# Patient Record
Sex: Female | Born: 1975 | Race: White | Hispanic: No | Smoking: Former smoker
Health system: Southern US, Community
[De-identification: ages and names within clinical notes are randomized; demographics above are authoritative.]

## PROBLEM LIST (undated history)

## (undated) DIAGNOSIS — R51 Headache: Secondary | ICD-10-CM

## (undated) DIAGNOSIS — Z9889 Other specified postprocedural states: Secondary | ICD-10-CM

## (undated) DIAGNOSIS — M1990 Unspecified osteoarthritis, unspecified site: Secondary | ICD-10-CM

## (undated) DIAGNOSIS — J45909 Unspecified asthma, uncomplicated: Secondary | ICD-10-CM

## (undated) DIAGNOSIS — R112 Nausea with vomiting, unspecified: Secondary | ICD-10-CM

## (undated) HISTORY — PX: TUBAL LIGATION: SHX77

---

## 2003-05-28 ENCOUNTER — Inpatient Hospital Stay (HOSPITAL_COMMUNITY): Admission: AD | Admit: 2003-05-28 | Discharge: 2003-05-28 | Payer: Self-pay | Admitting: Obstetrics and Gynecology

## 2003-08-13 ENCOUNTER — Encounter (INDEPENDENT_AMBULATORY_CARE_PROVIDER_SITE_OTHER): Payer: Self-pay | Admitting: *Deleted

## 2003-08-13 ENCOUNTER — Inpatient Hospital Stay (HOSPITAL_COMMUNITY): Admission: AD | Admit: 2003-08-13 | Discharge: 2003-08-15 | Payer: Self-pay | Admitting: Obstetrics and Gynecology

## 2003-11-20 ENCOUNTER — Other Ambulatory Visit: Admission: RE | Admit: 2003-11-20 | Discharge: 2003-11-20 | Payer: Self-pay | Admitting: Obstetrics and Gynecology

## 2004-03-28 DIAGNOSIS — M1990 Unspecified osteoarthritis, unspecified site: Secondary | ICD-10-CM

## 2004-03-28 HISTORY — DX: Unspecified osteoarthritis, unspecified site: M19.90

## 2004-12-07 ENCOUNTER — Other Ambulatory Visit: Admission: RE | Admit: 2004-12-07 | Discharge: 2004-12-07 | Payer: Self-pay | Admitting: Family Medicine

## 2005-05-20 ENCOUNTER — Encounter: Admission: RE | Admit: 2005-05-20 | Discharge: 2005-05-20 | Payer: Self-pay | Admitting: Family Medicine

## 2005-12-26 ENCOUNTER — Other Ambulatory Visit: Admission: RE | Admit: 2005-12-26 | Discharge: 2005-12-26 | Payer: Self-pay | Admitting: Family Medicine

## 2007-01-22 ENCOUNTER — Other Ambulatory Visit: Admission: RE | Admit: 2007-01-22 | Discharge: 2007-01-22 | Payer: Self-pay | Admitting: Family Medicine

## 2009-05-26 ENCOUNTER — Other Ambulatory Visit: Admission: RE | Admit: 2009-05-26 | Discharge: 2009-05-26 | Payer: Self-pay | Admitting: Family Medicine

## 2009-10-13 ENCOUNTER — Emergency Department (HOSPITAL_BASED_OUTPATIENT_CLINIC_OR_DEPARTMENT_OTHER): Admission: EM | Admit: 2009-10-13 | Discharge: 2009-10-13 | Payer: Self-pay | Admitting: Emergency Medicine

## 2010-03-28 DIAGNOSIS — Z9889 Other specified postprocedural states: Secondary | ICD-10-CM

## 2010-03-28 DIAGNOSIS — R112 Nausea with vomiting, unspecified: Secondary | ICD-10-CM

## 2010-03-28 HISTORY — DX: Nausea with vomiting, unspecified: R11.2

## 2010-03-28 HISTORY — PX: ENDOMETRIAL ABLATION: SHX621

## 2010-03-28 HISTORY — DX: Other specified postprocedural states: Z98.890

## 2010-06-02 ENCOUNTER — Other Ambulatory Visit (HOSPITAL_COMMUNITY)
Admission: RE | Admit: 2010-06-02 | Discharge: 2010-06-02 | Disposition: A | Discharge status: Home / Self Care | Payer: BC Managed Care – PPO | Source: Ambulatory Visit | Attending: Family Medicine | Admitting: Family Medicine

## 2010-06-02 ENCOUNTER — Other Ambulatory Visit: Payer: Self-pay | Admitting: Physician Assistant

## 2010-06-02 DIAGNOSIS — Z01419 Encounter for gynecological examination (general) (routine) without abnormal findings: Secondary | ICD-10-CM | POA: Insufficient documentation

## 2010-06-12 LAB — URINE MICROSCOPIC-ADD ON

## 2010-06-12 LAB — URINALYSIS, ROUTINE W REFLEX MICROSCOPIC
Bilirubin Urine: NEGATIVE
Protein, ur: NEGATIVE mg/dL
Urobilinogen, UA: 1 mg/dL (ref 0.0–1.0)

## 2010-07-07 ENCOUNTER — Other Ambulatory Visit: Payer: Self-pay | Admitting: Obstetrics and Gynecology

## 2010-08-13 NOTE — Op Note (Signed)
NAME:  Candice Shaw, Candice Shaw                      ACCOUNT NO.:  [id number]   MEDICAL RECORD NO.:  [id number]                   PATIENT TYPE:  INP   LOCATION:  9126                                 [facility]ILITY:  [facility]   PHYSICIAN:  Naima A. Dillard, M.D.              DATE OF BIRTH:  December 21, 1975   DATE OF PROCEDURE:  08/13/2003  DATE OF DISCHARGE:                                 OPERATIVE REPORT   PROCEDURE:  Vacuum assisted vaginal delivery.   DESCRIPTION OF PROCEDURE:  I was called to see the patient secondary to  maternal exhaustion.  The patient had been pushing for three hours. She was  told that her options were vacuum assisted vaginal delivery with the risk of  scalp abrasions, cephalohematoma, intraventricular brain hemorrhage versus a  cesarean section with the risk of bleeding, infection, damage to internal  organs like bowel or bladder. The patient and husband chose vacuum vaginal  delivery.  The patient was examined and noted to be plus 2 station at left  occiput anterior. Her bladder was attempted to be drained with a red rubber  catheter but no urine was seen to come probably secondary to the infant's  head. The vacuum was placed in the correct position and the head was vacuum  assisted to the perineum with four pulls, no pop-offs, each pull was in the  green zone at 500 mmHg and vacuum was in place for 12 minutes. There was  terminal meconium noted, there was no nuchal cord, body delivered without  difficulty.  The cord was clamped and cut, handed over to the nurse.  The  placenta was delivered spontaneously intact. There was a partial third  degree laceration which was prepared with #0 Vicryl and 2-0 chromic in the  normal fashion.  The placenta was sent to pathology, estimated blood loss  was about 300 mL. There were no complications. A rectal exam was done before  the third degree laceration was repaired to confirm third degree laceration  and after repair a rectal exam was done  and no sutures were noted to be  palpated.  The infant's Apgar were 8 & 9, a female infant born at 83.                                               Naima A. Normand Sloop, M.D.    NAD/MEDQ  D:  08/13/2003  T:  08/14/2003  Job:  [id number]

## 2010-08-13 NOTE — H&P (Signed)
NAME:  Candice Shaw, Candice Shaw                      ACCOUNT NO.:  [id number]   MEDICAL RECORD NO.:  [id number]                   PATIENT TYPE:  INP   LOCATION:  9165                                 [facility]   PHYSICIAN:  Hal Morales, M.D.             DATE OF BIRTH:  04/09/1975   DATE OF ADMISSION:  08/13/2003  DATE OF DISCHARGE:                                HISTORY & PHYSICAL   HISTORY:  Ms. Bocek is a 35 year old gravida 2, para 0, 0, 1, 0 at 40-1/7  weeks who presents with uterine contractions every 2 to 4 minutes times  several hours.  She denies any leaking or bleeding and reports positive  fetal movement.  Denies headache, visual symptoms or epigastric pain.  Does  complain of nausea but no vomiting.   PREGNANCY HAS BEEN REMARKABLE FOR:  1. History of irritable bowel syndrome.  2. Family history of cleft lip.  3. Equivocal rubella.  4. History of migraines.  5. First trimester spotting.  6. Skoliosis.  7. History of asthma.  8. Mild depression.   PRENATAL LABS:  Blood type is A positive.  Rh antibody negative.  VDRL  nonreactive.  Rubella titer is equivocal.  Hepatitis B surface antigen  negative.  Cystic fibrosis testing negative.  GC and Chlamydia were negative  at the first trimester.  Pap had been done previous to her first visit.  AFP  was normal.  Hemoglobin upon entering the practice was 13.9, it was 12.6 at  27 weeks.  Group B strep culture and gonorrhea/Chlamydia cultures were  negative at 36 weeks.  EDC on Aug 12, 2003 was established by last menstrual  period and was in agreement with ultrasound at approximately 11 and 18  weeks.   HISTORY OF PRESENT PREGNANCY:  The patient entered care at approximately 12  weeks.  She had an ultrasound just prior to that time that showed  appropriate dating.  She was using Darvocet for migraines at that point.  She had some first trimester spotting that was evaluated.  Quadruple screen  was normal, she had a normal  ultrasound at 18 weeks.  She some occasional  nausea and vomiting, and some diarrhea at 20 weeks.  She had some issues of  mild depression at 21 weeks, these did improve.  She had some laryngitis at  24 weeks.  She had a normal Glucola.  She had leg cramps throughout her  pregnancy but never had any abnormal findings.  She was seen by charge  admissions on May 28, 2003 for an asthma attack. She was using Phenergan  more frequently at that point.  She has some pustular lesions on her right  forearm at 35 weeks.  She was referred to the dermatologist for this.  The  rest of her pregnancy was essentially uncomplicated.  Beta strep, GC and  Chlamydia cultures were negative at 36 weeks.   OBSTETRICAL HISTORY:  In 1990  patient had a spontaneous miscarriage at 12  weeks without complications.   PAST MEDICAL HISTORY:  1. She was on Ortho-Tri-Cyclen until August of 2004.  2. She has a history of varicose veins.  3. She has a history of childhood asthma.  4. Irritable bowel syndrome.  5. Occasional urinary tract infections.   ALLERGIES:  She is sensitive to CODEINE.   FAMILY HISTORY:  She is unsure of her mother's history secondary to not  being raised by her.  Her father, paternal grandmother and paternal  grandfather had hypertension.  Her paternal grandmother had varicose veins.  Father had diabetes.  Father and paternal grandfather had renal disease and  were on dialysis.  Paternal grandmother had rheumatoid arthritis.   GENETIC HISTORY:  Remarkable for the paternal grandfather having a cleft  lip, and the patient having scoliosis.   SOCIAL HISTORY:  The patient is married to the father of the baby. He is  involved and supportive, his name is Tamanika Heiney.  The patient is  Caucasian, of the [place] faith.  She has been followed by the [facility], [place].  She denies any alcohol or drug  use during this pregnancy.  She was a smoker prior to  pregnancy.  She has a  high school education.  She is employed at a [employer] resources [employer].  Her  husband has a high school education, he is also currently employed.   PHYSICAL EXAMINATION:  VITAL SIGNS:  Stable, the patient is afebrile.  HEENT:  Within normal limits.  LUNGS:  Breath sounds are clear.  HEART:  Regular rate and rhythm without murmur.  BREASTS:  Soft and nontender.  ABDOMEN:  Fundal height is approximately 38 cm.  Estimated fetal weight is 7  to 7-1/2 pounds.  Uterine contractions every 2 to 4 minutes, 60 seconds in  duration, moderate to strong quality.  Fetal heart rate is reactive with no  decelerations, and negative spontaneously CST.  PELVIC:  Cervix is 4 cm, 100%, vertex at a -1 to 0 station with an intact  bag of water.  EXTREMITIES:  Deep tendon reflexes are 2+ without clonus.  There is trace  edema noted.   IMPRESSION:  1. Intrauterine pregnancy at 40-1/7 weeks.  2. Active labor.   PLAN:  1. Admit to birthing suite for consult with Dr. Pennie Rushing as attending     physician.  2. Routine certified nurse midwife orders.  3. Patient plans epidural.     Chip Boer L. Emilee Hero, C.N.M.                   Hal Morales, M.D.    Leeanne Mannan  D:  08/13/2003  T:  08/13/2003  Job:  [id number]

## 2010-08-13 NOTE — Discharge Summary (Signed)
NAME:  Candice Shaw, Candice Shaw                      ACCOUNT NO.:  [id number]   MEDICAL RECORD NO.:  [id number]                   PATIENT TYPE:  INP   LOCATION:  9126                                 [facility]   PHYSICIAN:  Hal Morales, M.D.             DATE OF BIRTH:  1975/04/03   DATE OF ADMISSION:  08/13/2003  DATE OF DISCHARGE:                                 DISCHARGE SUMMARY   ADMISSION DIAGNOSES:  1. Intrauterine pregnancy at term.  2. Active labor.  3. Negative group B streptococcus.   DISCHARGE DIAGNOSES:  1. Intrauterine pregnancy at term.  2. Active labor.  3. Negative group B streptococcus.  4. Status post vacuum-assisted vaginal delivery.  5. Undecided regarding contraception.  6. Breast feeding.   HOSPITAL PROCEDURES:  Vacuum-assisted vaginal delivery on Aug 13, 2003 for  delivery of a viable female infant named Eliberto Ivory who had Apgars of 8 and 9 and  weighed 8 pounds 2 ounces, attended in delivery by Dr. Jaymes Graff and  Mack Guise, C.N.M.   HOSPITAL COURSE:  Mrs. Bleecker is a 35 year old married black female gravida  2 para 0-0-1-0 at 33 and one-seventh weeks admitted in early active labor.  She progressed nicely in labor to complete by approximately 10:15 a.m. on  Aug 13, 2003 and had a strong urge to push at that time.  She pushed well  for approximately two-and-a-half hours and got the vertex to a +2 to +3  station but without significant progress beyond that and was significantly  exhausted.  Dr. Normand Sloop was then consulted for discussion of vacuum-assisted  delivery versus cesarean section and those were both reviewed with the  patient in detail by Dr. Normand Sloop.  The patient agreed to proceed with a  vacuum-assisted vaginal delivery attempt and underwent the same for delivery  of a viable female infant named Eliberto Ivory who weighed 8 pounds 2 ounces and had  Apgars of 8 and 9 on Aug 13, 2003.  Please see delivery note for further  details.  Postpartally the  patient has done well.  She is ambulating,  voiding, and eating and without difficulty.  Her vital signs are stable and  she is afebrile and she is tolerating a regular diet without difficulty.  She is breastfeeding well.  She is currently undecided regarding  contraception.  She is deemed ready for discharge today.   DISCHARGE INSTRUCTIONS:  As per the [facility] handout.   DISCHARGE MEDICATIONS:  1. Motrin 600 mg p.o. q.6h. p.r.n. for pain.  2. Darvocet one to two p.o. q.4-6h. p.r.n. for pain.  3. Prenatal vitamins daily.   DISCHARGE LABORATORY DATA:  Her hemoglobin is 10.8, her wbc count is 14.4,  and her platelets are 286.   DISCHARGE FOLLOW-UP:  In 6 weeks at [facility] or p.r.n.   DISCHARGE CONDITION:  Stable and well.     Concha Pyo. Duplantis, C.N.M.  Hal Morales, M.D.    SJD/MEDQ  D:  08/15/2003  T:  08/15/2003  Job:  [id number]

## 2010-09-21 ENCOUNTER — Ambulatory Visit (HOSPITAL_COMMUNITY)
Admission: EM | Admit: 2010-09-21 | Discharge: 2010-09-21 | Disposition: A | Discharge status: Home / Self Care | Payer: BC Managed Care – PPO | Source: Ambulatory Visit | Attending: Obstetrics and Gynecology | Admitting: Obstetrics and Gynecology

## 2010-09-21 DIAGNOSIS — Z302 Encounter for sterilization: Secondary | ICD-10-CM | POA: Insufficient documentation

## 2010-09-21 DIAGNOSIS — N920 Excessive and frequent menstruation with regular cycle: Secondary | ICD-10-CM | POA: Insufficient documentation

## 2010-09-21 LAB — CBC
Hemoglobin: 13.5 g/dL (ref 12.0–15.0)
MCH: 31.5 pg (ref 26.0–34.0)
Platelets: 276 10*3/uL (ref 150–400)
RDW: 12.3 % (ref 11.5–15.5)
WBC: 7.4 10*3/uL (ref 4.0–10.5)

## 2010-09-23 ENCOUNTER — Other Ambulatory Visit: Payer: Self-pay | Admitting: Obstetrics and Gynecology

## 2010-09-23 ENCOUNTER — Ambulatory Visit
Admission: RE | Admit: 2010-09-23 | Discharge: 2010-09-23 | Disposition: A | Discharge status: Home / Self Care | Payer: BC Managed Care – PPO | Source: Ambulatory Visit | Attending: Obstetrics and Gynecology | Admitting: Obstetrics and Gynecology

## 2010-09-23 DIAGNOSIS — R110 Nausea: Secondary | ICD-10-CM

## 2010-09-23 MED ORDER — IOHEXOL 300 MG/ML  SOLN
100.0000 mL | Freq: Once | INTRAMUSCULAR | Status: AC | PRN
  Administered 2010-09-23: 100 mL via INTRAVENOUS

## 2010-09-27 NOTE — Op Note (Signed)
Candice Shaw, Candice Shaw            ACCOUNT NO.:  [id number]  MEDICAL RECORD NO.:  [id number]  LOCATION:  [facility]                          [facility]  PHYSICIAN:  Patsy Baltimore, MD     DATE OF BIRTH:  May 22, 1975  DATE OF PROCEDURE:  09/21/2010 DATE OF DISCHARGE:                              OPERATIVE REPORT   PREOPERATIVE DIAGNOSIS:  Menorrhagia.  POSTOPERATIVE DIAGNOSIS:  Menorrhagia.  PROCEDURE PERFORMED: 1. Essure permanent sterilization. 2. NovaSure endometrial ablation.  SURGEON:  Patsy Baltimore, MD.  ANESTHESIA:  General.  SPECIMENS SENT:  None.  ESTIMATED BLOOD LOSS:  Minimal.  COMPLICATIONS:  The right Essure coil dislodged with insertion of the NovaSure and the coil was removed, therefore, the right tube remains patent.    Candice Shaw is a 35 year old para 1 who was seen in the office with a chief complaint of heavy menses.  She declined the alternatives and opted for NovaSure endometrial ablation.  For her comfort, we opted to do the procedure in the operating room as an outpatient procedure.  She also expressed a desire for permanent sterilization.  She currently uses oral contraceptive pills, but she was interested in the Essure permanent sterilization.  The risks, benefits and alternatives of the procedure were discussed with the patient and informed consent was obtained for the Essure sterilization and  NovaSure endometrial ablation.  On the day of surgery, she was taken to the operating room with IV fluids running.  She had SCDs on for DVT prophylaxis.  She was put under general anesthesia with ease, her legs were lifted up to the dorsal lithotomy position.  Her vagina and perineum were prepped and draped in the usual sterile fashion.  A time-out was called and we began the procedure.  A speculum was inserted into the vagina.  A single-toothed tenaculum was used to grasp the anterior lip of the cervix.  The cervical length was measured at  3-1/2 cm.  The total uterine length was measured at 7-1/2 cm giving a uterine cavity length of 4 cm.  The NovaSure machine was set accordingly.  Hysteroscopy was done and both fallopian tubal ostia were visualized.  The Essure coils were then inserted, the right side was relatively easy to do, but due to the angle, the left side was slightly more difficult to reach.  The position of the coils were confirmed and there were 3-4 coils sticking into the uterus once the coils were deployed.  After the Essure, the NovaSure device was inserted into the uterus.  With the cavity assessment, the width was determined to be 3 cm and the machine was set accordingly. The ablation was done over 1 minute and 6 seconds on 66 watts of power. It was noted that when the NovaSure device was removed from the uterus after the ablation, the right array had hooked onto the Essure coil. Hysteroscopy was done again and the left coil was seen intact and in place.  I was not able to visualize the right tubal ostia due to the ablation and fluffy tissue surrounding the ostium, therefore, the Essure coil was not replaced on the right side.  The patient tolerated the procedure well.  All the instruments  were removed from the vagina.  She was reawakened and transferred back to the PACU in stable condition.          ______________________________ Patsy Baltimore, MD     CO/MEDQ  D:  09/21/2010  T:  09/22/2010  Job:  [id number]  Electronically Signed by Patsy Baltimore MD on 09/27/2010 07:03:28 PM

## 2010-11-04 ENCOUNTER — Encounter (HOSPITAL_COMMUNITY): Payer: Self-pay | Admitting: *Deleted

## 2010-11-10 ENCOUNTER — Ambulatory Visit (HOSPITAL_COMMUNITY)
Admission: RE | Admit: 2010-11-10 | Discharge: 2010-11-10 | Disposition: A | Discharge status: Home / Self Care | Payer: BC Managed Care – PPO | Source: Ambulatory Visit | Attending: Obstetrics and Gynecology | Admitting: Obstetrics and Gynecology

## 2010-11-10 ENCOUNTER — Encounter (HOSPITAL_COMMUNITY): Payer: Self-pay | Admitting: Anesthesiology

## 2010-11-10 ENCOUNTER — Other Ambulatory Visit: Payer: Self-pay | Admitting: Obstetrics and Gynecology

## 2010-11-10 ENCOUNTER — Encounter (HOSPITAL_COMMUNITY)
Admission: RE | Disposition: A | Discharge status: Home / Self Care | Payer: Self-pay | Source: Ambulatory Visit | Attending: Obstetrics and Gynecology

## 2010-11-10 ENCOUNTER — Ambulatory Visit (HOSPITAL_COMMUNITY): Payer: BC Managed Care – PPO | Admitting: Anesthesiology

## 2010-11-10 DIAGNOSIS — Z302 Encounter for sterilization: Secondary | ICD-10-CM | POA: Insufficient documentation

## 2010-11-10 DIAGNOSIS — Z9851 Tubal ligation status: Secondary | ICD-10-CM

## 2010-11-10 HISTORY — DX: Headache: R51

## 2010-11-10 HISTORY — DX: Nausea with vomiting, unspecified: R11.2

## 2010-11-10 HISTORY — DX: Unspecified osteoarthritis, unspecified site: M19.90

## 2010-11-10 HISTORY — PX: LAPAROSCOPIC TUBAL LIGATION: SHX1937

## 2010-11-10 HISTORY — DX: Other specified postprocedural states: Z98.890

## 2010-11-10 LAB — CBC
HCT: 40.8 % (ref 36.0–46.0)
Hemoglobin: 14 g/dL (ref 12.0–15.0)
MCH: 31.6 pg (ref 26.0–34.0)
MCV: 92.1 fL (ref 78.0–100.0)
RBC: 4.43 MIL/uL (ref 3.87–5.11)

## 2010-11-10 SURGERY — LIGATION, FALLOPIAN TUBE, LAPAROSCOPIC
Anesthesia: General | Site: Abdomen | Laterality: Bilateral | Wound class: Clean Contaminated

## 2010-11-10 MED ORDER — PANTOPRAZOLE SODIUM 40 MG PO TBEC: 40.0000 mg | DELAYED_RELEASE_TABLET | Freq: Once | ORAL | Status: DC | PRN

## 2010-11-10 MED ORDER — LIDOCAINE HCL (CARDIAC) 20 MG/ML IV SOLN
INTRAVENOUS | Status: DC | PRN
  Administered 2010-11-10: 30 mg via INTRAVENOUS

## 2010-11-10 MED ORDER — FENTANYL CITRATE 0.05 MG/ML IJ SOLN
INTRAMUSCULAR | Status: AC
  Administered 2010-11-10: 50 ug via INTRAVENOUS
  Filled 2010-11-10: qty 2

## 2010-11-10 MED ORDER — FENTANYL CITRATE 0.05 MG/ML IJ SOLN
INTRAMUSCULAR | Status: AC
  Filled 2010-11-10: qty 5

## 2010-11-10 MED ORDER — LIDOCAINE HCL (CARDIAC) 20 MG/ML IV SOLN
INTRAVENOUS | Status: AC
  Filled 2010-11-10: qty 5

## 2010-11-10 MED ORDER — ROCURONIUM BROMIDE 100 MG/10ML IV SOLN
INTRAVENOUS | Status: DC | PRN
  Administered 2010-11-10: 30 mg via INTRAVENOUS

## 2010-11-10 MED ORDER — KETOROLAC TROMETHAMINE 30 MG/ML IJ SOLN
INTRAMUSCULAR | Status: AC
  Filled 2010-11-10: qty 1

## 2010-11-10 MED ORDER — GLYCOPYRROLATE 0.2 MG/ML IJ SOLN
INTRAMUSCULAR | Status: AC
  Filled 2010-11-10: qty 1

## 2010-11-10 MED ORDER — MIDAZOLAM HCL 2 MG/2ML IJ SOLN
INTRAMUSCULAR | Status: AC
  Filled 2010-11-10: qty 2

## 2010-11-10 MED ORDER — BUPIVACAINE HCL (PF) 0.25 % IJ SOLN
INTRAMUSCULAR | Status: DC | PRN
  Administered 2010-11-10: 20 mL

## 2010-11-10 MED ORDER — LACTATED RINGERS IV SOLN
INTRAVENOUS | Status: DC
  Administered 2010-11-10: 15:00:00 via INTRAVENOUS

## 2010-11-10 MED ORDER — ROCURONIUM BROMIDE 50 MG/5ML IV SOLN
INTRAVENOUS | Status: AC
  Filled 2010-11-10: qty 1

## 2010-11-10 MED ORDER — TRAMADOL HCL 50 MG PO TABS: 50.0000 mg | ORAL_TABLET | Freq: Four times a day (QID) | ORAL | Status: AC | PRN

## 2010-11-10 MED ORDER — METOCLOPRAMIDE HCL 10 MG PO TABS: 10.0000 mg | ORAL_TABLET | Freq: Once | ORAL | Status: DC | PRN

## 2010-11-10 MED ORDER — SCOPOLAMINE 1 MG/3DAYS TD PT72
MEDICATED_PATCH | TRANSDERMAL | Status: AC
  Administered 2010-11-10: 1.5 mg via TRANSDERMAL
  Filled 2010-11-10: qty 1

## 2010-11-10 MED ORDER — FAMOTIDINE 20 MG PO TABS: 20.0000 mg | ORAL_TABLET | Freq: Once | ORAL | Status: DC | PRN

## 2010-11-10 MED ORDER — GLYCOPYRROLATE 0.2 MG/ML IJ SOLN
INTRAMUSCULAR | Status: DC | PRN
  Administered 2010-11-10: .6 mg via INTRAVENOUS

## 2010-11-10 MED ORDER — KETOROLAC TROMETHAMINE 30 MG/ML IJ SOLN: 15.0000 mg | Freq: Once | INTRAMUSCULAR | Status: DC | PRN

## 2010-11-10 MED ORDER — FENTANYL CITRATE 0.05 MG/ML IJ SOLN
INTRAMUSCULAR | Status: DC | PRN
  Administered 2010-11-10 (×5): 50 ug via INTRAVENOUS
  Administered 2010-11-10: 100 ug via INTRAVENOUS

## 2010-11-10 MED ORDER — ACETAMINOPHEN 325 MG PO TABS: 325.0000 mg | ORAL_TABLET | ORAL | Status: DC | PRN

## 2010-11-10 MED ORDER — ONDANSETRON HCL 4 MG/2ML IJ SOLN
INTRAMUSCULAR | Status: AC
  Filled 2010-11-10: qty 2

## 2010-11-10 MED ORDER — HYDROCODONE-ACETAMINOPHEN 5-325 MG PO TABS
ORAL_TABLET | ORAL | Status: AC
  Filled 2010-11-10: qty 1

## 2010-11-10 MED ORDER — PROPOFOL 10 MG/ML IV EMUL
INTRAVENOUS | Status: AC
  Filled 2010-11-10: qty 20

## 2010-11-10 MED ORDER — PROMETHAZINE HCL 25 MG/ML IJ SOLN: 6.2500 mg | INTRAMUSCULAR | Status: DC | PRN

## 2010-11-10 MED ORDER — MIDAZOLAM HCL 5 MG/5ML IJ SOLN
INTRAMUSCULAR | Status: DC | PRN
  Administered 2010-11-10: 2 mg via INTRAVENOUS

## 2010-11-10 MED ORDER — ONDANSETRON HCL 4 MG/2ML IJ SOLN
INTRAMUSCULAR | Status: DC | PRN
  Administered 2010-11-10: 4 mg via INTRAVENOUS

## 2010-11-10 MED ORDER — LACTATED RINGERS IV SOLN
INTRAVENOUS | Status: DC
  Administered 2010-11-10: 17:00:00 via INTRAVENOUS

## 2010-11-10 MED ORDER — NEOSTIGMINE METHYLSULFATE 1 MG/ML IJ SOLN
INTRAMUSCULAR | Status: AC
  Filled 2010-11-10: qty 10

## 2010-11-10 MED ORDER — NEOSTIGMINE METHYLSULFATE 1 MG/ML IJ SOLN
INTRAMUSCULAR | Status: DC | PRN
  Administered 2010-11-10: 3 mg via INTRAVENOUS

## 2010-11-10 MED ORDER — KETOROLAC TROMETHAMINE 30 MG/ML IJ SOLN
INTRAMUSCULAR | Status: DC | PRN
  Administered 2010-11-10: 30 mg via INTRAVENOUS

## 2010-11-10 MED ORDER — PROPOFOL 10 MG/ML IV EMUL
INTRAVENOUS | Status: DC | PRN
  Administered 2010-11-10: 150 mg via INTRAVENOUS

## 2010-11-10 MED ORDER — CITRIC ACID-SODIUM CITRATE 334-500 MG/5ML PO SOLN: 30.0000 mL | Freq: Once | ORAL | Status: DC | PRN

## 2010-11-10 MED ORDER — DEXAMETHASONE SODIUM PHOSPHATE 4 MG/ML IJ SOLN
INTRAMUSCULAR | Status: DC | PRN
  Administered 2010-11-10: 8 mg via INTRAVENOUS

## 2010-11-10 MED ORDER — FENTANYL CITRATE 0.05 MG/ML IJ SOLN
INTRAMUSCULAR | Status: AC
  Filled 2010-11-10: qty 2

## 2010-11-10 MED ORDER — SCOPOLAMINE 1 MG/3DAYS TD PT72
1.0000 | MEDICATED_PATCH | Freq: Once | TRANSDERMAL | Status: DC | PRN
  Administered 2010-11-10: 1.5 mg via TRANSDERMAL

## 2010-11-10 MED ORDER — MUPIROCIN 2 % EX OINT
TOPICAL_OINTMENT | CUTANEOUS | Status: AC
  Administered 2010-11-10: 1
  Filled 2010-11-10: qty 22

## 2010-11-10 MED ORDER — FENTANYL CITRATE 0.05 MG/ML IJ SOLN
25.0000 ug | INTRAMUSCULAR | Status: DC | PRN
  Administered 2010-11-10 (×2): 50 ug via INTRAVENOUS

## 2010-11-10 SURGICAL SUPPLY — 17 items
APPLICATOR COTTON TIP 6IN STRL (MISCELLANEOUS) ×2 IMPLANT
CATH ROBINSON RED A/P 16FR (CATHETERS) ×2 IMPLANT
CLIP FILSHIE TUBAL LIGA STRL (Clip) ×2 IMPLANT
CLOTH BEACON ORANGE TIMEOUT ST (SAFETY) ×2 IMPLANT
DERMABOND ADVANCED (GAUZE/BANDAGES/DRESSINGS) ×2 IMPLANT
GLOVE BIOGEL M 6.5 STRL (GLOVE) ×2 IMPLANT
GLOVE BIOGEL PI IND STRL 6.5 (GLOVE) ×2 IMPLANT
GLOVE BIOGEL PI INDICATOR 6.5 (GLOVE) ×2
GOWN PREVENTION PLUS LG XLONG (DISPOSABLE) ×2 IMPLANT
GOWN PREVENTION PLUS XLARGE (GOWN DISPOSABLE) ×2 IMPLANT
PACK LAPAROSCOPY BASIN (CUSTOM PROCEDURE TRAY) ×2 IMPLANT
SUT VICRYL 0 UR6 27IN ABS (SUTURE) ×2 IMPLANT
SUT VICRYL 4-0 PS2 18IN ABS (SUTURE) ×2 IMPLANT
TOWEL OR 17X24 6PK STRL BLUE (TOWEL DISPOSABLE) ×4 IMPLANT
TROCAR XCEL NON-BLD 11X100MML (ENDOMECHANICALS) ×2 IMPLANT
WARMER LAPAROSCOPE (MISCELLANEOUS) ×2 IMPLANT
WATER STERILE IRR 1000ML POUR (IV SOLUTION) ×2 IMPLANT

## 2010-11-10 NOTE — Anesthesia Postprocedure Evaluation (Signed)
  Anesthesia Post Note  Patient: Candice Shaw  Procedure(s) Performed:  LAPAROSCOPIC TUBAL LIGATION - with filshie clips   Anesthesia type: GA  Patient location: PACU  Post pain: Pain level controlled  Post assessment: Post-op Vital signs reviewed  Last Vitals:  Filed Vitals:   11/10/10 1815  BP: 92/64  Pulse: 107  Temp:   Resp: 37    Post vital signs: Reviewed  Level of consciousness: sedated  Complications: No apparent anesthesia complications

## 2010-11-10 NOTE — H&P (Signed)
No changes to H&P.

## 2010-11-10 NOTE — Progress Notes (Signed)
Pt extremely anxious, crying and states pain is severe, emotional support and pain meds given will continue to monitor and manage.

## 2010-11-10 NOTE — Transfer of Care (Signed)
Immediate Anesthesia Transfer of Care Note  Patient: Candice Shaw  Procedure(s) Performed:  LAPAROSCOPIC TUBAL LIGATION - with filshie clips   Patient Location: PACU  Anesthesia Type: General  Level of Consciousness: awake, alert , oriented and patient cooperative  Airway & Oxygen Therapy: Patient Spontanous Breathing and Patient connected to nasal cannula oxygen  Post-op Assessment: Report given to PACU RN, Post -op Vital signs reviewed and stable and Patient moving all extremities  Post vital signs: Reviewed and stable  Complications: No apparent anesthesia complications

## 2010-11-10 NOTE — Progress Notes (Signed)
Pt states she cannot take Ultram as prescribed by md for pain due to nausea.  Dr Richardson Dopp made aware and verbal orders received, Vicodin 5/500 1-2 po q 6hrs prn as needed for pain, and Phenergan 25mg  1 po q 6hrs as needed for pain called to [facility] off [address]

## 2010-11-10 NOTE — Op Note (Signed)
11/10/2010  5:56 PM  PATIENT:  Candice Shaw  35 y.o. female  PRE-OPERATIVE DIAGNOSIS:  desire sterislization  POST-OPERATIVE DIAGNOSIS:  desire sterislization  PROCEDURE:  Procedure(s): LAPAROSCOPIC TUBAL LIGATION  SURGEON:  Surgeon(s): Dorien Chihuahua. Chandi Nicklin  PHYSICIAN ASSISTANT:   ASSISTANTS: none   ANESTHESIA:   general  ESTIMATED BLOOD LOSS: * No blood loss amount entered *   BLOOD ADMINISTERED:none  DRAINS: none   LOCAL MEDICATIONS USED:  MARCAINE  20 CC  SPECIMEN:  No Specimen  DISPOSITION OF SPECIMEN:  N/A  COUNTS:  YES  TOURNIQUET:  * No tourniquets in log *  DICTATION #: NA  PLAN OF CARE: d/c home once patient meets pacu discharge criteria   PATIENT DISPOSITION:  PACU - hemodynamically stable.   Delay start of Pharmacological VTE agent (>24hrs) due to surgical blood loss or risk of bleeding:  not applicable    Procedure: Patient was taken to the operating room where she was placed under general anesthesia. She was placed in the dorsal lithotomy position. She was prepped and draped in the usual sterile fashion. A speculum was placed in the vaginal vault. The anterior lip of the cervix was grasped with single-tooth tenaculum and uterine miniplate was placed without difficulty. Single-tooth tenaculum was removed. In the speculum was removed. Attention was turned to the abdomen where a 10 mm umbilical incision was made with a scalpel. Prior to making the incision on 10 cc of quarter percent Marcaine were injected at the umbilicus. A 10 mm trocar was placed under direct visualization. Pneumoperitoneum was achieved with CO2 gas. The pelvis was examined with an attempt normal fallopian tubes and ovaries bilaterally. At this point the operative scope was used and a Filshie clip was placed along the right fallopian tube fimbria were visualized. This is repeated on the left fallopian tube. 10 5 cc of quarter percent Marcaine was injected along the placement of the Filshie  clips bilaterally. Pneumoperitoneum was released and the 10 mm trocar was removed under direct visualization. Fascia was reapproximated with 0 Vicryl. Skin was closed with 4-0 Vicryl. Followed by Dermabond. Sponge and needle counts were correct x2.  Findings normal fallopian tubes and ovaries bilaterally. Complications none.

## 2010-11-10 NOTE — Anesthesia Preprocedure Evaluation (Addendum)
Anesthesia Evaluation  Name, MR# and DOB Patient awake  General Assessment Comment  Reviewed: Allergy & Precautions, H&P , NPO status , Patient's Chart, lab work & pertinent test results, reviewed documented beta blocker date and time   History of Anesthesia Complications (+) PONV  Airway Mallampati: II TM Distance: >3 FB Neck ROM: Full    Dental No notable dental hx. (+) Teeth Intact   Pulmonary  clear to auscultation  pulmonary exam normalPulmonary Exam Normal breath sounds clear to auscultation none    Cardiovascular Exercise Tolerance: Good Regular Normal    Neuro/Psych   Headaches   Negative Neurological ROS  Negative Psych ROS  GI/Hepatic/Renal negative GI ROS, negative Liver ROS, and negative Renal ROS (+)       Endo/Other  Negative Endocrine ROS (+)      Abdominal   Musculoskeletal negative musculoskeletal ROS (+)   Hematology negative hematology ROS (+)   Peds  Reproductive/Obstetrics negative OB ROS    Anesthesia Other Findings            Anesthesia Physical Anesthesia Plan  ASA: II  Anesthesia Plan: General   Post-op Pain Management:    Induction: Intravenous  Airway Management Planned: Oral ETT  Additional Equipment:   Intra-op Plan:   Post-operative Plan: Extubation in OR  Informed Consent: I have reviewed the patients History and Physical, chart, labs and discussed the procedure including the risks, benefits and alternatives for the proposed anesthesia with the patient or authorized representative who has indicated his/her understanding and acceptance.   Dental advisory given  Plan Discussed with: Anesthesiologist  Anesthesia Plan Comments:         Anesthesia Quick Evaluation

## 2010-11-10 NOTE — Anesthesia Procedure Notes (Signed)
Performed by: Rosalia Hammers

## 2010-12-06 ENCOUNTER — Encounter (HOSPITAL_COMMUNITY): Payer: Self-pay | Admitting: Obstetrics and Gynecology

## 2011-06-03 ENCOUNTER — Other Ambulatory Visit: Payer: Self-pay | Admitting: Physician Assistant

## 2011-06-03 ENCOUNTER — Other Ambulatory Visit (HOSPITAL_COMMUNITY)
Admission: RE | Admit: 2011-06-03 | Discharge: 2011-06-03 | Disposition: A | Discharge status: Home / Self Care | Payer: BC Managed Care – PPO | Source: Ambulatory Visit | Attending: Family Medicine | Admitting: Family Medicine

## 2011-06-03 DIAGNOSIS — Z124 Encounter for screening for malignant neoplasm of cervix: Secondary | ICD-10-CM | POA: Insufficient documentation

## 2012-06-27 ENCOUNTER — Other Ambulatory Visit: Payer: Self-pay | Admitting: Physician Assistant

## 2012-06-27 ENCOUNTER — Other Ambulatory Visit (HOSPITAL_COMMUNITY)
Admission: RE | Admit: 2012-06-27 | Discharge: 2012-06-27 | Disposition: A | Discharge status: Home / Self Care | Payer: BC Managed Care – PPO | Source: Ambulatory Visit | Attending: Family Medicine | Admitting: Family Medicine

## 2012-06-27 DIAGNOSIS — Z124 Encounter for screening for malignant neoplasm of cervix: Secondary | ICD-10-CM | POA: Insufficient documentation

## 2013-07-02 ENCOUNTER — Other Ambulatory Visit (HOSPITAL_COMMUNITY)
Admission: RE | Admit: 2013-07-02 | Discharge: 2013-07-02 | Disposition: A | Discharge status: Home / Self Care | Payer: Medicaid Other | Source: Ambulatory Visit | Attending: Family Medicine | Admitting: Family Medicine

## 2013-07-02 ENCOUNTER — Other Ambulatory Visit: Payer: Self-pay | Admitting: Physician Assistant

## 2013-07-02 DIAGNOSIS — Z124 Encounter for screening for malignant neoplasm of cervix: Secondary | ICD-10-CM | POA: Insufficient documentation

## 2013-07-05 ENCOUNTER — Other Ambulatory Visit: Payer: Self-pay

## 2013-07-05 DIAGNOSIS — Z1231 Encounter for screening mammogram for malignant neoplasm of breast: Secondary | ICD-10-CM

## 2013-07-19 ENCOUNTER — Encounter (INDEPENDENT_AMBULATORY_CARE_PROVIDER_SITE_OTHER): Payer: Self-pay

## 2013-07-19 ENCOUNTER — Ambulatory Visit
Admission: RE | Admit: 2013-07-19 | Discharge: 2013-07-19 | Disposition: A | Discharge status: Home / Self Care | Payer: Self-pay | Source: Ambulatory Visit

## 2013-07-19 DIAGNOSIS — Z1231 Encounter for screening mammogram for malignant neoplasm of breast: Secondary | ICD-10-CM

## 2014-06-16 ENCOUNTER — Other Ambulatory Visit: Payer: Self-pay

## 2014-06-16 DIAGNOSIS — Z1231 Encounter for screening mammogram for malignant neoplasm of breast: Secondary | ICD-10-CM

## 2014-07-04 ENCOUNTER — Other Ambulatory Visit: Payer: Self-pay | Admitting: Physician Assistant

## 2014-07-04 ENCOUNTER — Other Ambulatory Visit (HOSPITAL_COMMUNITY)
Admission: RE | Admit: 2014-07-04 | Discharge: 2014-07-04 | Disposition: A | Discharge status: Home / Self Care | Payer: Medicaid Other | Source: Ambulatory Visit | Attending: Family Medicine | Admitting: Family Medicine

## 2014-07-04 DIAGNOSIS — Z124 Encounter for screening for malignant neoplasm of cervix: Secondary | ICD-10-CM | POA: Insufficient documentation

## 2014-07-08 LAB — CYTOLOGY - PAP

## 2014-07-21 ENCOUNTER — Ambulatory Visit
Admission: RE | Admit: 2014-07-21 | Discharge: 2014-07-21 | Disposition: A | Discharge status: Home / Self Care | Payer: Medicaid Other | Source: Ambulatory Visit

## 2014-07-21 DIAGNOSIS — Z1231 Encounter for screening mammogram for malignant neoplasm of breast: Secondary | ICD-10-CM

## 2014-07-23 ENCOUNTER — Other Ambulatory Visit: Payer: Self-pay | Admitting: Physician Assistant

## 2014-07-23 DIAGNOSIS — R928 Other abnormal and inconclusive findings on diagnostic imaging of breast: Secondary | ICD-10-CM

## 2014-07-31 ENCOUNTER — Other Ambulatory Visit: Payer: Medicaid Other

## 2014-08-04 ENCOUNTER — Ambulatory Visit
Admission: RE | Admit: 2014-08-04 | Discharge: 2014-08-04 | Disposition: A | Discharge status: Home / Self Care | Payer: Medicaid Other | Source: Ambulatory Visit | Attending: Physician Assistant | Admitting: Physician Assistant

## 2014-08-04 DIAGNOSIS — R928 Other abnormal and inconclusive findings on diagnostic imaging of breast: Secondary | ICD-10-CM

## 2014-11-12 ENCOUNTER — Other Ambulatory Visit: Payer: Self-pay | Admitting: Physician Assistant

## 2014-11-12 DIAGNOSIS — R102 Pelvic and perineal pain unspecified side: Secondary | ICD-10-CM

## 2014-11-12 DIAGNOSIS — K589 Irritable bowel syndrome without diarrhea: Secondary | ICD-10-CM

## 2014-11-17 ENCOUNTER — Ambulatory Visit
Admission: RE | Admit: 2014-11-17 | Discharge: 2014-11-17 | Disposition: A | Discharge status: Home / Self Care | Payer: Medicaid Other | Source: Ambulatory Visit | Attending: Physician Assistant | Admitting: Physician Assistant

## 2014-11-17 DIAGNOSIS — R102 Pelvic and perineal pain: Secondary | ICD-10-CM

## 2014-11-17 DIAGNOSIS — K589 Irritable bowel syndrome without diarrhea: Secondary | ICD-10-CM

## 2014-11-17 MED ORDER — IOPAMIDOL (ISOVUE-300) INJECTION 61%
100.0000 mL | Freq: Once | INTRAVENOUS | Status: AC | PRN
  Administered 2014-11-17: 100 mL via INTRAVENOUS

## 2015-05-25 ENCOUNTER — Encounter (HOSPITAL_COMMUNITY): Payer: Self-pay | Admitting: Emergency Medicine

## 2015-05-25 ENCOUNTER — Emergency Department (HOSPITAL_COMMUNITY)
Admission: EM | Admit: 2015-05-25 | Discharge: 2015-05-26 | Disposition: A | Discharge status: Home / Self Care | Payer: Medicaid Other | Attending: Emergency Medicine | Admitting: Emergency Medicine

## 2015-05-25 DIAGNOSIS — Z87891 Personal history of nicotine dependence: Secondary | ICD-10-CM | POA: Diagnosis not present

## 2015-05-25 DIAGNOSIS — Z3202 Encounter for pregnancy test, result negative: Secondary | ICD-10-CM | POA: Diagnosis not present

## 2015-05-25 DIAGNOSIS — M1990 Unspecified osteoarthritis, unspecified site: Secondary | ICD-10-CM | POA: Insufficient documentation

## 2015-05-25 DIAGNOSIS — G43909 Migraine, unspecified, not intractable, without status migrainosus: Secondary | ICD-10-CM | POA: Insufficient documentation

## 2015-05-25 DIAGNOSIS — J45909 Unspecified asthma, uncomplicated: Secondary | ICD-10-CM | POA: Diagnosis not present

## 2015-05-25 DIAGNOSIS — N72 Inflammatory disease of cervix uteri: Secondary | ICD-10-CM | POA: Diagnosis not present

## 2015-05-25 DIAGNOSIS — R1032 Left lower quadrant pain: Secondary | ICD-10-CM

## 2015-05-25 DIAGNOSIS — R102 Pelvic and perineal pain: Secondary | ICD-10-CM | POA: Diagnosis present

## 2015-05-25 DIAGNOSIS — Z79899 Other long term (current) drug therapy: Secondary | ICD-10-CM | POA: Insufficient documentation

## 2015-05-25 HISTORY — DX: Unspecified asthma, uncomplicated: J45.909

## 2015-05-25 LAB — CBC
HEMATOCRIT: 37.6 % (ref 36.0–46.0)
HEMOGLOBIN: 12.8 g/dL (ref 12.0–15.0)
MCH: 30.6 pg (ref 26.0–34.0)
MCHC: 34 g/dL (ref 30.0–36.0)
MCV: 90 fL (ref 78.0–100.0)
PLATELETS: 250 10*3/uL (ref 150–400)
RBC: 4.18 MIL/uL (ref 3.87–5.11)
RDW: 12.3 % (ref 11.5–15.5)
WBC: 5.8 10*3/uL (ref 4.0–10.5)

## 2015-05-25 LAB — COMPREHENSIVE METABOLIC PANEL
ALT: 15 U/L (ref 14–54)
ANION GAP: 8 (ref 5–15)
AST: 19 U/L (ref 15–41)
Albumin: 3.7 g/dL (ref 3.5–5.0)
Alkaline Phosphatase: 33 U/L — ABNORMAL LOW (ref 38–126)
BUN: 16 mg/dL (ref 6–20)
CHLORIDE: 107 mmol/L (ref 101–111)
CO2: 26 mmol/L (ref 22–32)
CREATININE: 0.56 mg/dL (ref 0.44–1.00)
Calcium: 8.8 mg/dL — ABNORMAL LOW (ref 8.9–10.3)
Glucose, Bld: 86 mg/dL (ref 65–99)
POTASSIUM: 4 mmol/L (ref 3.5–5.1)
SODIUM: 141 mmol/L (ref 135–145)
Total Bilirubin: 0.4 mg/dL (ref 0.3–1.2)
Total Protein: 6.4 g/dL — ABNORMAL LOW (ref 6.5–8.1)

## 2015-05-25 LAB — URINE MICROSCOPIC-ADD ON

## 2015-05-25 LAB — URINALYSIS, ROUTINE W REFLEX MICROSCOPIC
Bilirubin Urine: NEGATIVE
GLUCOSE, UA: NEGATIVE mg/dL
Hgb urine dipstick: NEGATIVE
Ketones, ur: NEGATIVE mg/dL
Nitrite: NEGATIVE
PROTEIN: NEGATIVE mg/dL
SPECIFIC GRAVITY, URINE: 1.034 — AB (ref 1.005–1.030)
pH: 6 (ref 5.0–8.0)

## 2015-05-25 NOTE — ED Notes (Signed)
Patient here with complaint of left pelvic pain. States onset 2 days ago. Reports that she had the same pain 2 weeks ago, it last a day or so, then she began her period. Denies other symptoms but states she feels "bloated".

## 2015-05-26 ENCOUNTER — Emergency Department (HOSPITAL_COMMUNITY): Payer: Medicaid Other

## 2015-05-26 LAB — PREGNANCY, URINE: Preg Test, Ur: NEGATIVE

## 2015-05-26 MED ORDER — AZITHROMYCIN 250 MG PO TABS
1000.0000 mg | ORAL_TABLET | Freq: Once | ORAL | Status: AC
Start: 2015-05-26 — End: 2015-05-26
  Administered 2015-05-26: 1000 mg via ORAL
  Filled 2015-05-26: qty 4

## 2015-05-26 MED ORDER — DOXYCYCLINE HYCLATE 100 MG PO CAPS: 100.0000 mg | ORAL_CAPSULE | Freq: Two times a day (BID) | ORAL | Status: DC

## 2015-05-26 MED ORDER — FLUCONAZOLE 150 MG PO TABS: 150.0000 mg | ORAL_TABLET | Freq: Once | ORAL | Status: DC

## 2015-05-26 MED ORDER — CEFTRIAXONE SODIUM 250 MG IJ SOLR
250.0000 mg | Freq: Once | INTRAMUSCULAR | Status: AC
  Administered 2015-05-26: 250 mg via INTRAMUSCULAR
  Filled 2015-05-26: qty 250

## 2015-05-26 MED ORDER — HYDROCODONE-ACETAMINOPHEN 5-325 MG PO TABS: 1.0000 | ORAL_TABLET | ORAL | Status: DC | PRN

## 2015-05-26 NOTE — ED Provider Notes (Signed)
CSN: [id number]     Arrival date & time 05/25/15  1951 History   First MD Initiated Contact with Patient 05/25/15 2335     Chief Complaint  Patient presents with  . Pelvic Pain     (Consider location/radiation/quality/duration/timing/severity/associated sxs/prior Treatment) Patient is a 40 y.o. female presenting with pelvic pain. The history is provided by the patient. No language interpreter was used.  Pelvic Pain This is a new problem. The current episode started yesterday. The problem occurs constantly. The problem has been gradually worsening. Pertinent negatives include no abdominal pain, fever, myalgias, nausea, vomiting or weakness. Associated symptoms comments: Complains of left pelvic pain x 2 days with vaginal discharge. No fever, nausea or vomiting. No change in bowel movements. No aggravating or alleviating factors. She denies dysuria or urinary frequency. No irregularity to her menses. .    Past Medical History  Diagnosis Date  . Headache(784.0)     migraines & sinus  . Arthritis 2006    right hand  . PONV (postoperative nausea and vomiting) 2012    had to get phenergan before d/c  . Asthma    Past Surgical History  Procedure Laterality Date  . Endometrial ablation  2012  . Laparoscopic tubal ligation  11/10/2010    Procedure: LAPAROSCOPIC TUBAL LIGATION;  Surgeon: Jessee Avers;  Location: [facility];  Service: Gynecology;  Laterality: Bilateral;  with filshie clips    History reviewed. No pertinent family history. Social History  Substance Use Topics  . Smoking status: Former Smoker -- 0.50 packs/day for 15 years    Types: Cigarettes    Quit date: 11/04/2003  . Smokeless tobacco: None  . Alcohol Use: No   OB History    No data available     Review of Systems  Constitutional: Negative for fever.  Respiratory: Negative for shortness of breath.   Gastrointestinal: Negative for nausea, vomiting, abdominal pain, diarrhea and constipation.  Genitourinary: Positive  for vaginal discharge and pelvic pain. Negative for dysuria, frequency, flank pain and vaginal bleeding.  Musculoskeletal: Negative for myalgias.  Neurological: Negative for syncope and weakness.      Allergies  Codeine  Home Medications   Prior to Admission medications   Medication Sig Start Date End Date Taking? Authorizing Provider  cetirizine (ZYRTEC) 10 MG tablet Take 10 mg by mouth daily as needed. For allergies     Historical Provider, MD  cholecalciferol (VITAMIN D) 1000 UNITS tablet Take 1,000 Units by mouth daily.      Historical Provider, MD  Cyanocobalamin (VITAMIN B 12 PO) Take 1 tablet by mouth daily. Patient unsure of dose     Historical Provider, MD  cyclobenzaprine (FLEXERIL) 10 MG tablet Take 10 mg by mouth at bedtime as needed. For headache     Historical Provider, MD  doxycycline (VIBRAMYCIN) 100 MG capsule Take 1 capsule (100 mg total) by mouth 2 (two) times daily. 05/26/15   [name], PA-C  ECHINACEA PO Take 1 tablet by mouth daily. Pt unsure of dose     Historical Provider, MD  fluconazole (DIFLUCAN) 150 MG tablet Take 1 tablet (150 mg total) by mouth once. Take after you complete the antibiotic course 05/26/15   [name], PA-C  HYDROcodone-acetaminophen (NORCO/VICODIN) 5-325 MG tablet Take 1-2 tablets by mouth every 4 (four) hours as needed. 05/26/15   [name], PA-C  ibuprofen (ADVIL,MOTRIN) 800 MG tablet Take 800 mg by mouth at bedtime as needed. For headache      Historical Provider, MD  Multiple Vitamin (MULTIVITAMIN) tablet Take 1 tablet by mouth daily. Nature Made Multi for Her     Historical Provider, MD  Omega-3 Fatty Acids (FISH OIL) 1200 MG CAPS Take 1 capsule by mouth daily.      Historical Provider, MD   BP 104/88 mmHg  Pulse 85  Temp(Src) 98.3 F (36.8 C) (Oral)  Resp 16  SpO2 100% Physical Exam  Constitutional: She is oriented to person, place, and time. She appears well-developed and well-nourished.  Neck: Normal range of motion.   Pulmonary/Chest: Effort normal.  Abdominal: Soft. There is no tenderness. There is no rebound.  Genitourinary:  Copious vaginal discharge. There is generalized pelvic tenderness, greater on left. NO palpable mass. +CMT.   Musculoskeletal: Normal range of motion.  Neurological: She is alert and oriented to person, place, and time.  Skin: Skin is warm and dry.    ED Course  Procedures (including critical care time) Labs Review Labs Reviewed  COMPREHENSIVE METABOLIC PANEL - Abnormal; Notable for the following:    Calcium 8.8 (*)    Total Protein 6.4 (*)    Alkaline Phosphatase 33 (*)    All other components within normal limits  URINALYSIS, ROUTINE W REFLEX MICROSCOPIC (NOT AT [facility]) - Abnormal; Notable for the following:    Specific Gravity, Urine 1.034 (*)    Leukocytes, UA TRACE (*)    All other components within normal limits  URINE MICROSCOPIC-ADD ON - Abnormal; Notable for the following:    Squamous Epithelial / LPF 0-5 (*)    Bacteria, UA FEW (*)    All other components within normal limits  WET PREP, GENITAL  CBC  PREGNANCY, URINE  GC/CHLAMYDIA PROBE AMP ([place]) NOT AT [facility]    Imaging Review US Transvaginal Non-ob  05/26/2015  CLINICAL DATA:  Acute onset of left lower quadrant abdominal pain. Initial encounter. EXAM: TRANSABDOMINAL AND TRANSVAGINAL ULTRASOUND OF PELVIS TECHNIQUE: Both transabdominal and transvaginal ultrasound examinations of the pelvis were performed. Transabdominal technique was performed for global imaging of the pelvis including uterus, ovaries, adnexal regions, and pelvic cul-de-sac. It was necessary to proceed with endovaginal exam following the transabdominal exam to visualize the adnexa. COMPARISON:  CT of the abdomen and pelvis performed 11/17/2014 FINDINGS: Uterus Measurements: 6.9 x 4.0 x 4.8 cm. No fibroids or other mass visualized. Endometrium Thickness: 0.5 cm.  No focal abnormality visualized. Right ovary Measurements: 3.7 x 1.8 x 1.9 cm.  Normal appearance/no adnexal mass. Left ovary Measurements: 2.3 x 1.6 x 1.7 cm. Normal appearance/no adnexal mass. Other findings A small amount of free fluid is noted within the pelvic cul-de-sac. IMPRESSION: Unremarkable pelvic ultrasound.  No evidence for ovarian torsion. Electronically Signed   By: Roanna Raider M.D.   On: 05/26/2015 01:17   US Pelvis Complete  05/26/2015  CLINICAL DATA:  Acute onset of left lower quadrant abdominal pain. Initial encounter. EXAM: TRANSABDOMINAL AND TRANSVAGINAL ULTRASOUND OF PELVIS TECHNIQUE: Both transabdominal and transvaginal ultrasound examinations of the pelvis were performed. Transabdominal technique was performed for global imaging of the pelvis including uterus, ovaries, adnexal regions, and pelvic cul-de-sac. It was necessary to proceed with endovaginal exam following the transabdominal exam to visualize the adnexa. COMPARISON:  CT of the abdomen and pelvis performed 11/17/2014 FINDINGS: Uterus Measurements: 6.9 x 4.0 x 4.8 cm. No fibroids or other mass visualized. Endometrium Thickness: 0.5 cm.  No focal abnormality visualized. Right ovary Measurements: 3.7 x 1.8 x 1.9 cm. Normal appearance/no adnexal mass. Left ovary Measurements: 2.3 x 1.6 x 1.7  cm. Normal appearance/no adnexal mass. Other findings A small amount of free fluid is noted within the pelvic cul-de-sac. IMPRESSION: Unremarkable pelvic ultrasound.  No evidence for ovarian torsion. Electronically Signed   By: Roanna Raider M.D.   On: 05/26/2015 01:17   I have personally reviewed and evaluated these images and lab results as part of my medical decision-making.   EKG Interpretation None      MDM   Final diagnoses:  Pelvic pain in female  Cervicitis    Patient is non-toxic in appearance. No fever. [place] of pelvis is negative. She has been treated with zithromax, Rocephin and will discharge home with 7 days Doxycycline. Stable for discharge.     [name], PA-C 05/26/15 2013  Pricilla Loveless, MD 05/28/15 [phone]

## 2015-05-26 NOTE — Discharge Instructions (Signed)
Cervicitis °Cervicitis is a soreness and swelling (inflammation) of the cervix. Your cervix is located at the bottom of your uterus. It opens up to the vagina. °CAUSES  °· Sexually transmitted infections (STIs).   °· Allergic reaction.   °· Medicines or birth control devices that are put in the vagina.   °· Injury to the cervix.   °· Bacterial infections.   °RISK FACTORS °You are at greater risk if you: °· Have unprotected sexual intercourse. °· Have sexual intercourse with many partners. °· Began sexual intercourse at an early age. °· Have a history of STIs. °SYMPTOMS  °There may be no symptoms. If symptoms occur, they may include:  °· Gray, white, yellow, or bad-smelling vaginal discharge.   °· Pain or itching of the area outside the vagina.   °· Painful sexual intercourse.   °· Lower abdominal or lower back pain, especially during intercourse.   °· Frequent urination.   °· Abnormal vaginal bleeding between periods, after sexual intercourse, or after menopause.   °· Pressure or a heavy feeling in the pelvis.   °DIAGNOSIS  °Diagnosis is made after a pelvic exam. Other tests may include:  °· Examination of any discharge under a microscope (wet prep).   °· A Pap test.   °TREATMENT  °Treatment will depend on the cause of cervicitis. If it is caused by an STI, both you and your partner will need to be treated. Antibiotic medicines will be given.  °HOME CARE INSTRUCTIONS  °· Do not have sexual intercourse until your health care provider says it is okay.   °· Do not have sexual intercourse until your partner has been treated, if your cervicitis is caused by an STI.   °· Take your antibiotics as directed. Finish them even if you start to feel better.   °SEEK MEDICAL CARE IF: °· Your symptoms come back.   °· You have a fever.   °MAKE SURE YOU:  °· Understand these instructions. °· Will watch your condition. °· Will get help right away if you are not doing well or get worse. °  °This information is not intended to replace  advice given to you by your health care provider. Make sure you discuss any questions you have with your health care provider. °  °Document Released: 03/14/2005 Document Revised: 03/19/2013 Document Reviewed: 09/05/2012 °Elsevier Interactive Patient Education ©2016 [employer] ° °

## 2015-05-27 LAB — GC/CHLAMYDIA PROBE AMP (~~LOC~~) NOT AT ARMC
Chlamydia: NEGATIVE
Neisseria Gonorrhea: NEGATIVE

## 2015-07-03 ENCOUNTER — Other Ambulatory Visit: Payer: Self-pay

## 2015-07-03 DIAGNOSIS — Z1231 Encounter for screening mammogram for malignant neoplasm of breast: Secondary | ICD-10-CM

## 2015-07-24 ENCOUNTER — Ambulatory Visit
Admission: RE | Admit: 2015-07-24 | Discharge: 2015-07-24 | Disposition: A | Discharge status: Home / Self Care | Payer: Medicaid Other | Source: Ambulatory Visit

## 2015-07-24 DIAGNOSIS — Z1231 Encounter for screening mammogram for malignant neoplasm of breast: Secondary | ICD-10-CM

## 2016-04-25 ENCOUNTER — Other Ambulatory Visit: Payer: Self-pay | Admitting: Physician Assistant

## 2016-04-25 DIAGNOSIS — Z1231 Encounter for screening mammogram for malignant neoplasm of breast: Secondary | ICD-10-CM

## 2016-07-12 IMAGING — CT CT ABD-PELV W/ CM
3 of 5 series · 13 of 36 positions shown, 19 images · IV contrast (READICAT/WATER & [ID] ISOVUE 300)
Comparison: 04/09/2014 and 09/23/2010

CLINICAL DATA: Pelvic pain 2 weeks.  History of IBS.

EXAM:
CT ABDOMEN AND PELVIS WITH CONTRAST
TECHNIQUE: Multidetector CT imaging of the abdomen and pelvis was performed
using the standard protocol following bolus administration of
intravenous contrast.
CONTRAST:  100mL [id number] IOPAMIDOL ([id number]) INJECTION 61%

[Series 3: abd/pelvis with · axial · 0.70mm/px · z∈[-314,-20]mm · 7 of 79 slices shown, 12 images]
[im 10/79  soft-tissue]
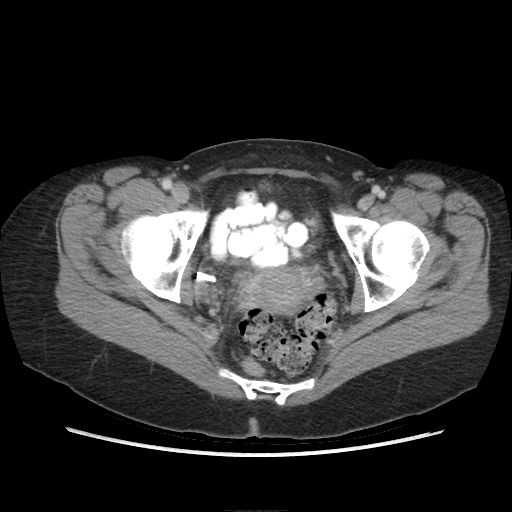
[im 10/79  bone]
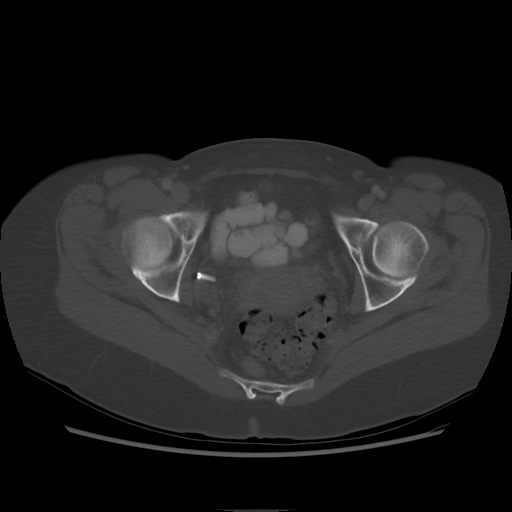
[im 20/79  soft-tissue]
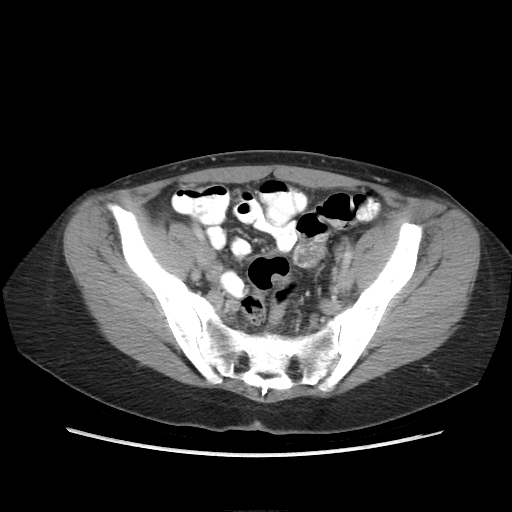
[im 30/79  soft-tissue]
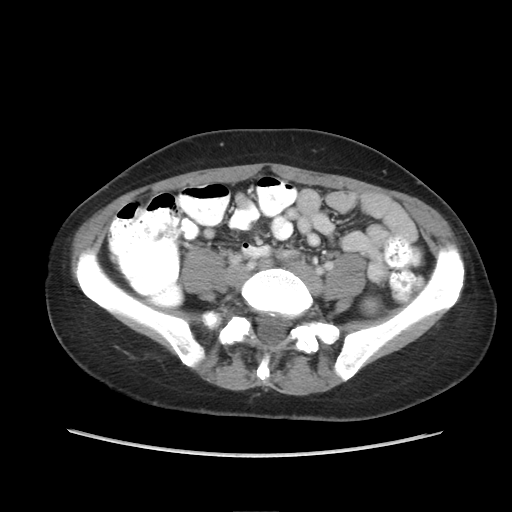
[im 40/79  soft-tissue]
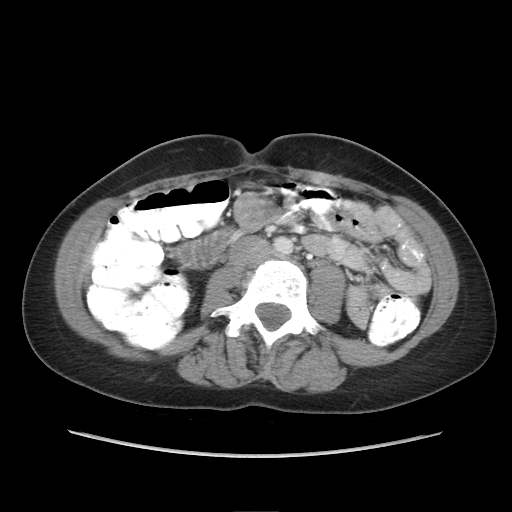
[im 40/79  lung]
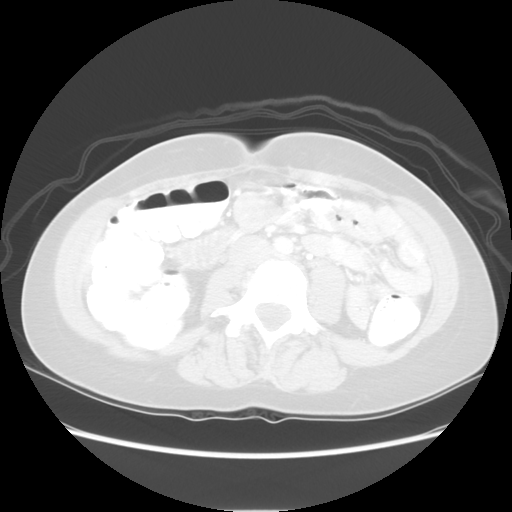
[im 49/79  soft-tissue]
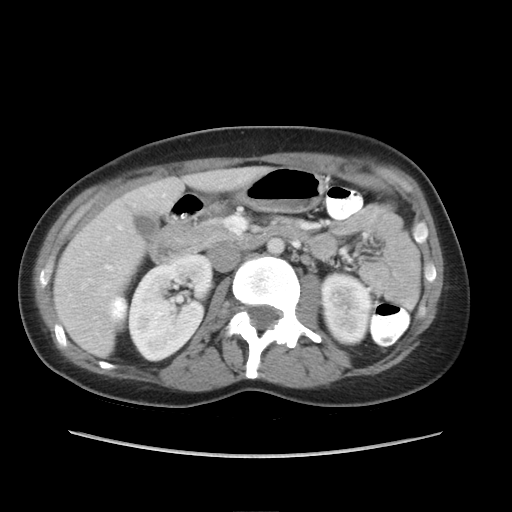
[im 49/79  lung]
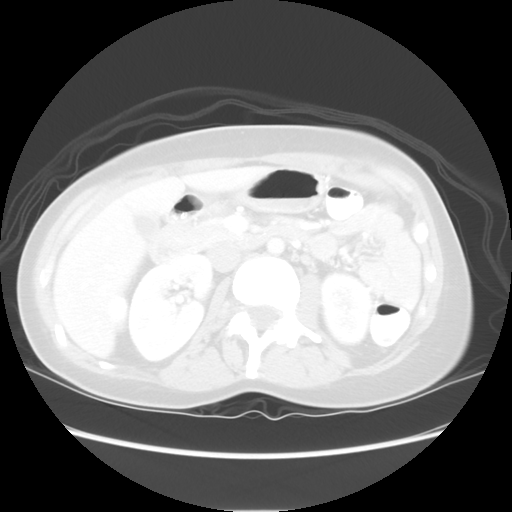
[im 59/79  soft-tissue]
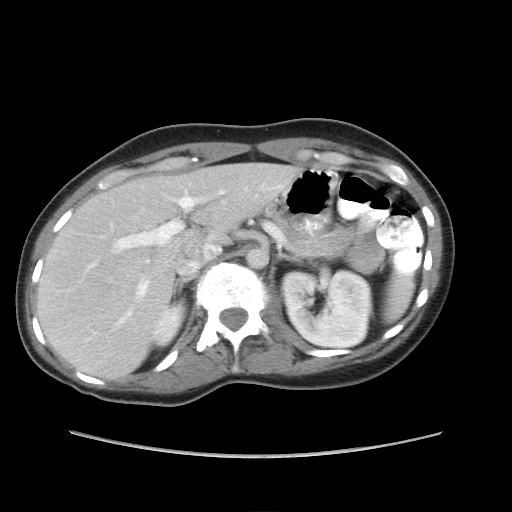
[im 59/79  lung]
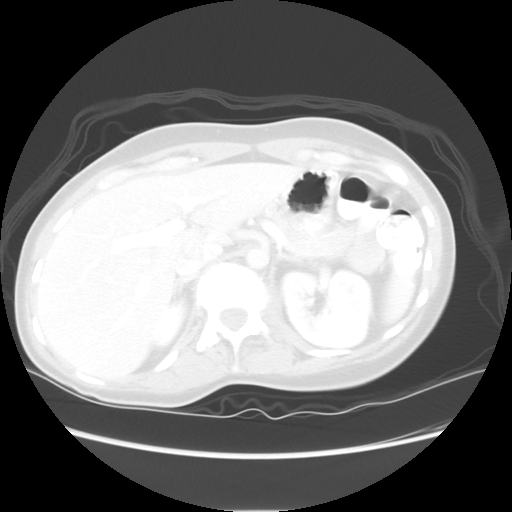
[im 69/79  soft-tissue]
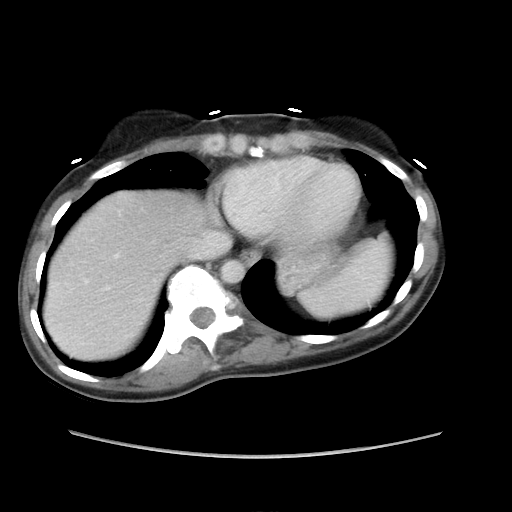
[im 69/79  lung]
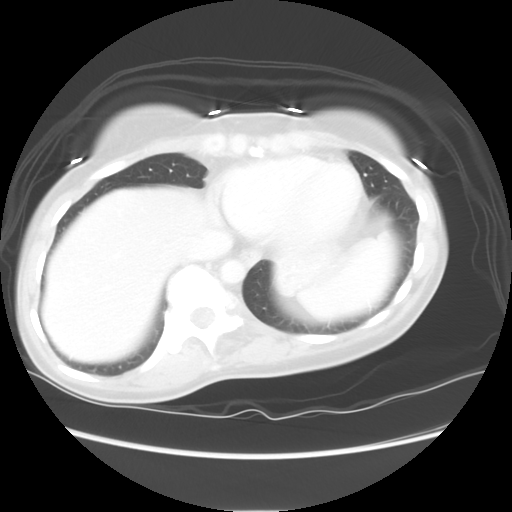

[Series 601: coronal body · coronal · 0.88mm/px · 1 of 109 slices shown, 2 images]
[im 37/109  soft-tissue]
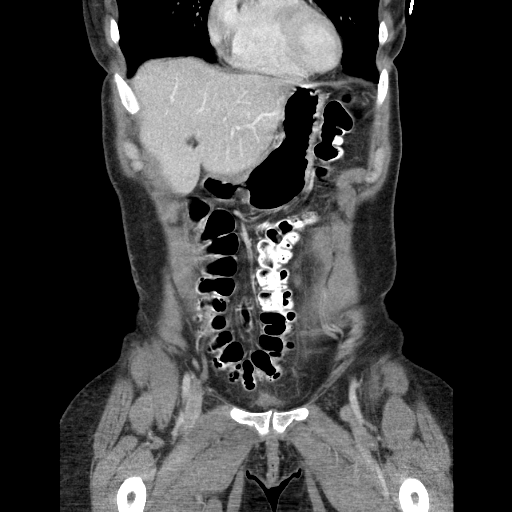
[im 37/109  bone]
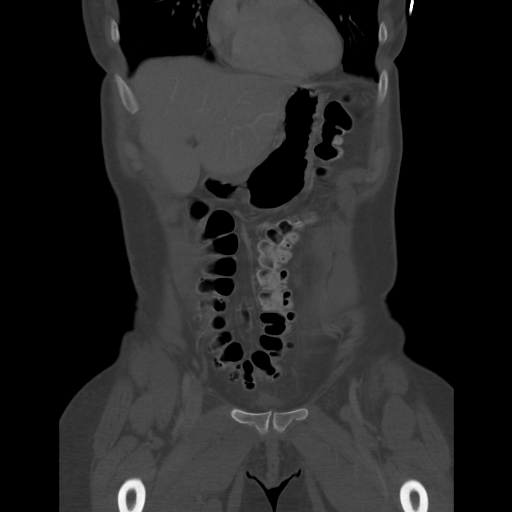

[Series 602: sagittal body · sagittal · 0.88mm/px · 5 of 145 slices shown]
[im 10/145  soft-tissue]
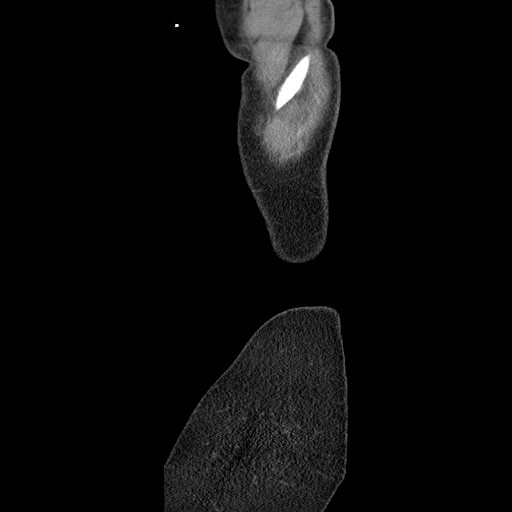
[im 28/145  soft-tissue]
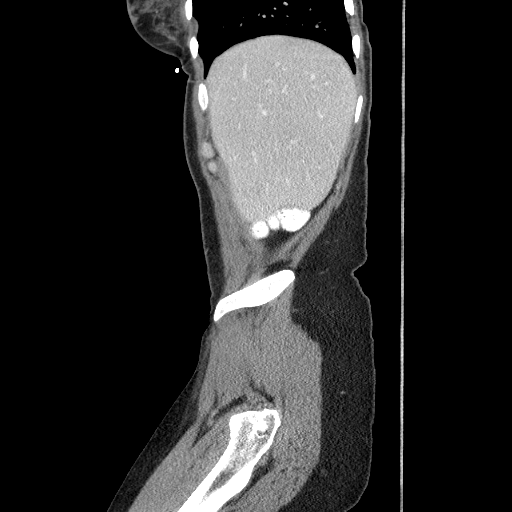
[im 46/145  soft-tissue]
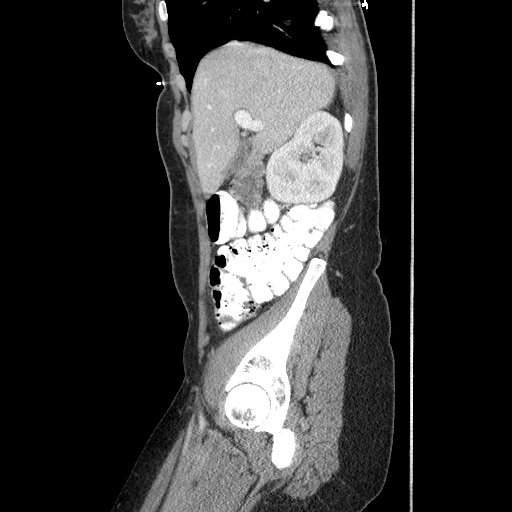
[im 64/145  soft-tissue]
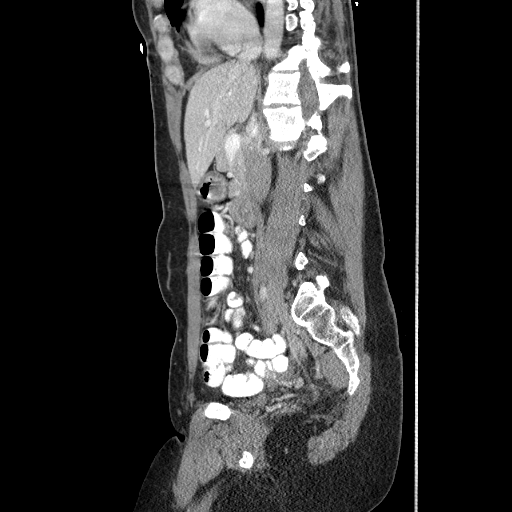
[im 82/145  soft-tissue]
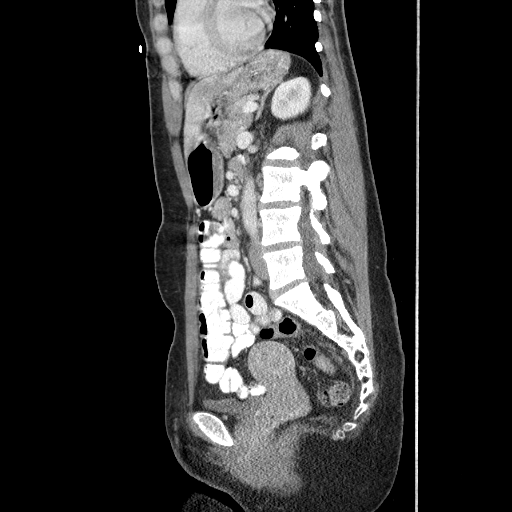

[13 of 36 positions shown; findings below may reference images not displayed]

FINDINGS: Lung bases are within normal.

Abdominal images demonstrate a normal liver, spleen, pancreas,
gallbladder and adrenal glands. Kidneys are normal in size. There
are 2 sub cm right renal cortical hypodensities unchanged, but too
small to characterize and likely cysts. Appendix is normal.

Mild contrast and stool throughout the colon which is otherwise
within normal. Small bowel is unremarkable. No evidence of free
fluid or inflammatory change. Vascular structures are within normal.

Pelvic images demonstrate evidence of previous bilateral tubal
ligation. Left-sided Essure device unchanged. The bladder is
decompressed. Rectosigmoid colon is unremarkable. No free fluid.
Moderate curvature of the spine unchanged.
IMPRESSION: No acute findings in the abdomen/pelvis.

Two small sub cm right renal cortical hypodensities unchanged and
too small to characterize but likely cysts.

## 2016-08-01 ENCOUNTER — Ambulatory Visit: Payer: Medicaid Other

## 2016-08-08 ENCOUNTER — Ambulatory Visit
Admission: RE | Admit: 2016-08-08 | Discharge: 2016-08-08 | Disposition: A | Discharge status: Home / Self Care | Payer: Medicaid Other | Source: Ambulatory Visit | Attending: Physician Assistant | Admitting: Physician Assistant

## 2016-08-08 ENCOUNTER — Other Ambulatory Visit: Payer: Self-pay | Admitting: Physician Assistant

## 2016-08-08 ENCOUNTER — Other Ambulatory Visit (HOSPITAL_COMMUNITY)
Admission: RE | Admit: 2016-08-08 | Discharge: 2016-08-08 | Disposition: A | Discharge status: Home / Self Care | Payer: Self-pay | Source: Ambulatory Visit | Attending: Family Medicine | Admitting: Family Medicine

## 2016-08-08 DIAGNOSIS — Z1231 Encounter for screening mammogram for malignant neoplasm of breast: Secondary | ICD-10-CM

## 2016-08-08 DIAGNOSIS — Z124 Encounter for screening for malignant neoplasm of cervix: Secondary | ICD-10-CM | POA: Insufficient documentation

## 2016-08-10 LAB — CYTOLOGY - PAP: DIAGNOSIS: NEGATIVE

## 2017-04-20 ENCOUNTER — Other Ambulatory Visit: Payer: Self-pay | Admitting: Physician Assistant

## 2017-04-20 DIAGNOSIS — Z1231 Encounter for screening mammogram for malignant neoplasm of breast: Secondary | ICD-10-CM

## 2017-08-28 ENCOUNTER — Ambulatory Visit: Payer: Self-pay

## 2017-09-04 ENCOUNTER — Ambulatory Visit
Admission: RE | Admit: 2017-09-04 | Discharge: 2017-09-04 | Disposition: A | Discharge status: Home / Self Care | Payer: No Typology Code available for payment source | Source: Ambulatory Visit | Attending: Physician Assistant | Admitting: Physician Assistant

## 2017-09-04 DIAGNOSIS — Z1231 Encounter for screening mammogram for malignant neoplasm of breast: Secondary | ICD-10-CM

## 2017-09-05 ENCOUNTER — Other Ambulatory Visit: Payer: Self-pay | Admitting: Physician Assistant

## 2017-09-05 DIAGNOSIS — R928 Other abnormal and inconclusive findings on diagnostic imaging of breast: Secondary | ICD-10-CM

## 2017-09-08 ENCOUNTER — Other Ambulatory Visit (HOSPITAL_COMMUNITY): Payer: Self-pay | Admitting: *Deleted

## 2017-09-08 DIAGNOSIS — R928 Other abnormal and inconclusive findings on diagnostic imaging of breast: Secondary | ICD-10-CM

## 2017-09-14 ENCOUNTER — Other Ambulatory Visit (HOSPITAL_COMMUNITY): Payer: Self-pay | Admitting: *Deleted

## 2017-09-14 DIAGNOSIS — R928 Other abnormal and inconclusive findings on diagnostic imaging of breast: Secondary | ICD-10-CM

## 2017-09-26 ENCOUNTER — Ambulatory Visit (HOSPITAL_COMMUNITY)
Admission: RE | Admit: 2017-09-26 | Discharge: 2017-09-26 | Disposition: A | Discharge status: Home / Self Care | Payer: Self-pay | Source: Ambulatory Visit | Attending: Obstetrics and Gynecology | Admitting: Obstetrics and Gynecology

## 2017-09-26 ENCOUNTER — Ambulatory Visit
Admission: RE | Admit: 2017-09-26 | Discharge: 2017-09-26 | Disposition: A | Discharge status: Home / Self Care | Payer: Self-pay | Source: Ambulatory Visit | Attending: Obstetrics and Gynecology | Admitting: Obstetrics and Gynecology

## 2017-09-26 ENCOUNTER — Encounter (HOSPITAL_COMMUNITY): Payer: Self-pay

## 2017-09-26 ENCOUNTER — Ambulatory Visit
Admission: RE | Admit: 2017-09-26 | Discharge: 2017-09-26 | Disposition: A | Discharge status: Home / Self Care | Payer: No Typology Code available for payment source | Source: Ambulatory Visit | Attending: Obstetrics and Gynecology | Admitting: Obstetrics and Gynecology

## 2017-09-26 VITALS — BP 110/68 | Ht 62.75 in | Wt 130.4 lb

## 2017-09-26 DIAGNOSIS — Z1239 Encounter for other screening for malignant neoplasm of breast: Secondary | ICD-10-CM

## 2017-09-26 DIAGNOSIS — R928 Other abnormal and inconclusive findings on diagnostic imaging of breast: Secondary | ICD-10-CM

## 2017-09-26 NOTE — Progress Notes (Signed)
[facility] due to recommending additional imaging of the left breast. Screening mammogram completed 09/04/2017.  Pap Smear: Pap smear not completed today. [facility] and normal. Per patient has no history of an abnormal Pap smear. Last Pap smear result is in Epic.  Physical exam: Breasts Breasts symmetrical. No skin abnormalities bilateral breasts. No nipple retraction bilateral breasts. No nipple discharge bilateral breasts. No lymphadenopathy. No lumps palpated bilateral breasts. No complaints of pain or tenderness on exam. [facility] for a left breast diagnostic mammogram and possible ultrasound per recommendation. Appointment scheduled for Tuesday, September 26, 2017 at 1450.        Pelvic/Bimanual No Pap smear completed today since last Pap smear was 08/08/2016. Pap smear not indicated per [facility] guidelines.   Smoking History: Patient is a former smoker that quit 15 years ago.  Patient Navigation: Patient education provided. Access to services provided for patient through [facility] program.   Breast and Cervical Cancer Risk Assessment: Patient has no family history of breast cancer, known genetic mutations, or radiation treatment to the chest before age 930. Patient has no history of cervical dysplasia, immunocompromised, or DES exposure in-utero.  Risk Assessment    Risk Scores      09/26/2017   Last edited by: Lynnell DikeHolland, Sabrina H, LPN   5-year risk: 0.9 %   Lifetime risk: 10.7 %

## 2017-09-26 NOTE — Patient Instructions (Signed)
Explained breast self awareness with Candice Roverhristina M Shaw. Patient did not need a Pap smear today due to last Pap smear was 08/08/2016. Let her know [facility] will cover Pap smears every 3 years unless has a history of abnormal Pap smears. [facility] for a left breast diagnostic mammogram and possible ultrasound per recommendation. Appointment scheduled for Tuesday, September 26, 2017 at 1450. Candice Shaw verbalized understanding.  Lan Mcneill, Candice Maserhristine Poll, RN 2:46 PM

## 2017-10-02 ENCOUNTER — Encounter (HOSPITAL_COMMUNITY): Payer: Self-pay | Admitting: *Deleted

## 2018-08-01 ENCOUNTER — Other Ambulatory Visit: Payer: Self-pay | Admitting: Obstetrics and Gynecology

## 2018-08-01 DIAGNOSIS — Z1231 Encounter for screening mammogram for malignant neoplasm of breast: Secondary | ICD-10-CM

## 2018-10-05 ENCOUNTER — Other Ambulatory Visit: Payer: Self-pay | Admitting: Physician Assistant

## 2018-10-05 ENCOUNTER — Ambulatory Visit
Admission: RE | Admit: 2018-10-05 | Discharge: 2018-10-05 | Disposition: A | Discharge status: Home / Self Care | Payer: No Typology Code available for payment source | Source: Ambulatory Visit | Attending: Obstetrics and Gynecology | Admitting: Obstetrics and Gynecology

## 2018-10-05 DIAGNOSIS — Z1231 Encounter for screening mammogram for malignant neoplasm of breast: Secondary | ICD-10-CM

## 2018-12-26 ENCOUNTER — Other Ambulatory Visit: Payer: Self-pay

## 2018-12-26 DIAGNOSIS — Z20822 Contact with and (suspected) exposure to covid-19: Secondary | ICD-10-CM

## 2018-12-27 LAB — NOVEL CORONAVIRUS, NAA: SARS-CoV-2, NAA: NOT DETECTED

## 2019-03-27 ENCOUNTER — Ambulatory Visit: Payer: Medicaid Other | Attending: Internal Medicine

## 2019-03-27 DIAGNOSIS — Z20828 Contact with and (suspected) exposure to other viral communicable diseases: Secondary | ICD-10-CM | POA: Insufficient documentation

## 2019-03-27 DIAGNOSIS — Z20822 Contact with and (suspected) exposure to covid-19: Secondary | ICD-10-CM

## 2019-03-28 LAB — NOVEL CORONAVIRUS, NAA: SARS-CoV-2, NAA: NOT DETECTED

## 2019-06-05 ENCOUNTER — Other Ambulatory Visit: Payer: Self-pay | Admitting: Physician Assistant

## 2019-06-05 DIAGNOSIS — Z1231 Encounter for screening mammogram for malignant neoplasm of breast: Secondary | ICD-10-CM

## 2019-06-14 ENCOUNTER — Other Ambulatory Visit: Payer: Self-pay

## 2019-06-14 ENCOUNTER — Emergency Department (HOSPITAL_COMMUNITY): Payer: No Typology Code available for payment source

## 2019-06-14 ENCOUNTER — Emergency Department (HOSPITAL_COMMUNITY)
Admission: EM | Admit: 2019-06-14 | Discharge: 2019-06-14 | Disposition: A | Discharge status: Home / Self Care | Payer: No Typology Code available for payment source | Attending: Emergency Medicine | Admitting: Emergency Medicine

## 2019-06-14 DIAGNOSIS — L03113 Cellulitis of right upper limb: Secondary | ICD-10-CM

## 2019-06-14 DIAGNOSIS — Z87891 Personal history of nicotine dependence: Secondary | ICD-10-CM | POA: Insufficient documentation

## 2019-06-14 DIAGNOSIS — J45909 Unspecified asthma, uncomplicated: Secondary | ICD-10-CM | POA: Insufficient documentation

## 2019-06-14 DIAGNOSIS — Z79899 Other long term (current) drug therapy: Secondary | ICD-10-CM | POA: Insufficient documentation

## 2019-06-14 MED ORDER — KETOROLAC TROMETHAMINE 15 MG/ML IJ SOLN
15.0000 mg | Freq: Once | INTRAMUSCULAR | Status: AC
  Administered 2019-06-14: 15 mg via INTRAMUSCULAR
  Filled 2019-06-14: qty 1

## 2019-06-14 MED ORDER — KETOROLAC TROMETHAMINE 15 MG/ML IJ SOLN: 15.0000 mg | Freq: Once | INTRAMUSCULAR | Status: DC

## 2019-06-14 MED ORDER — CEPHALEXIN 500 MG PO CAPS
500.0000 mg | ORAL_CAPSULE | Freq: Once | ORAL | Status: AC
  Administered 2019-06-14: 23:00:00 500 mg via ORAL
  Filled 2019-06-14: qty 1

## 2019-06-14 MED ORDER — CEPHALEXIN 500 MG PO CAPS: 500.0000 mg | ORAL_CAPSULE | Freq: Four times a day (QID) | ORAL | 0 refills | Status: AC

## 2019-06-14 NOTE — ED Provider Notes (Signed)
[name] Provider Note   CSN: [id number] Arrival date & time: 06/14/19  2105     History Chief Complaint  Patient presents with  . Wrist Pain    right wrist    Candice Shaw is a 44 y.o. female.  HPI HPI Comments: Candice Shaw is a 44 y.o. female who is right hand dominant presents to the Emergency Department complaining of worsening right lateral wrist pain x 1 day. 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 Her pain worsens with any movement of the wrist or palpation of the region. She took 800mg  ibuprofen this AM with little relief. She denies fevers, chills, n/v/d, CP, SOB.      Past Medical History:  Diagnosis Date  . 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 Asthma   . Headache(784.0)    migraines & sinus  . PONV (postoperative nausea and vomiting) 2012   had to get phenergan before d/c    There are no problems to display for this patient.   Past Surgical History:  Procedure Laterality Date  . ENDOMETRIAL ABLATION  2012  . LAPAROSCOPIC TUBAL LIGATION  11/10/2010   Procedure: LAPAROSCOPIC TUBAL LIGATION;  Surgeon: 11/12/2010;  Location: [facility];  Service: Gynecology;  Laterality: Bilateral;  with filshie clips   . TUBAL LIGATION       OB History    Gravida  1   Para      Term      Preterm      AB      Living  1     SAB      TAB      Ectopic      Multiple      Live Births  1           Family History  Problem Relation Age of Onset  . Breast cancer Cousin     Social History   Tobacco Use  . Smoking status: Former Smoker    Packs/day: 0.50    Years: 15.00    Pack years: 7.50    Types: Cigarettes    Quit date: 11/04/2003    Years since quitting: 15.6  . Smokeless tobacco: Never Used  Substance Use Topics  . Alcohol use: Yes    Comment: OCCASSIONALLY  . Drug use: No    Home  Medications Prior to Admission medications   Medication Sig Start Date End Date Taking? Authorizing Provider  cephALEXin (KEFLEX) 500 MG capsule Take 1 capsule (500 mg total) by mouth 4 (four) times daily for 7 days. 06/14/19 06/21/19  06/23/19, PA-C  cetirizine (ZYRTEC) 10 MG tablet Take 10 mg by mouth daily as needed. For allergies     [provider]  cholecalciferol (VITAMIN D) 1000 UNITS tablet Take 1,000 Units by mouth daily.      [provider]  Cyanocobalamin (VITAMIN B 12 PO) Take 1 tablet by mouth daily. Patient unsure of dose     [provider]  cyclobenzaprine (FLEXERIL) 10 MG tablet Take 10 mg by mouth at bedtime as needed. For headache     [provider]  doxycycline (VIBRAMYCIN) 100 MG capsule Take 1 capsule (100 mg total) by mouth 2 (two) times daily. Patient not taking: Reported on 09/26/2017 05/26/15   05/28/15, PA-C  ECHINACEA PO Take 1 tablet by mouth daily.  Pt unsure of dose     [provider]  fluconazole (DIFLUCAN) 150 MG tablet Take 1 tablet (150 mg total) by mouth once. Take after you complete the antibiotic course Patient not taking: Reported on 09/26/2017 05/26/15   [name], PA-C  HYDROcodone-acetaminophen (NORCO/VICODIN) 5-325 MG tablet Take 1-2 tablets by mouth every 4 (four) hours as needed. Patient not taking: Reported on 09/26/2017 05/26/15   [name], PA-C  ibuprofen (ADVIL,MOTRIN) 800 MG tablet Take 800 mg by mouth at bedtime as needed. For headache      [provider]  Multiple Vitamin (MULTIVITAMIN) tablet Take 1 tablet by mouth daily. Nature Made Multi for Her     [provider]  Omega-3 Fatty Acids (FISH OIL) 1200 MG CAPS Take 1 capsule by mouth daily.      [provider]  valACYclovir (VALTREX) 500 MG tablet Take 500 mg by mouth 2 (two) times daily as needed.    [provider]    Allergies    Codeine  Review of Systems   Review of Systems   Constitutional: Negative for chills and fever.  Respiratory: Negative for shortness of breath.   Cardiovascular: Negative for chest pain.  Gastrointestinal: Negative for diarrhea, nausea and vomiting.  Musculoskeletal: Positive for arthralgias, joint swelling and myalgias.  Skin: Positive for color change and rash. Negative for wound.  Neurological: Negative for syncope and numbness.  All other systems reviewed and are negative.  Physical Exam Updated Vital Signs BP 130/63 (BP Location: Left Arm)   Pulse 78   Temp 98.1 F (36.7 C) (Oral)   Resp 18   Ht 5' 2.75" (1.594 m)   Wt 59 kg   LMP 06/11/2019   SpO2 100%   BMI 23.21 kg/m   Physical Exam Vitals and nursing note reviewed.  Constitutional:      General: She is not in acute distress.    Appearance: Normal appearance. She is normal weight. She is not ill-appearing, toxic-appearing or diaphoretic.  HENT:     Head: Normocephalic and atraumatic.     Nose: Nose normal.  Eyes:     Extraocular Movements: Extraocular movements intact.  Cardiovascular:     Rate and Rhythm: Normal rate and regular rhythm.     Pulses: Normal pulses.     Heart sounds: Normal heart sounds. No murmur. No friction rub. No gallop.   Pulmonary:     Effort: Pulmonary effort is normal. No respiratory distress.     Breath sounds: Normal breath sounds. No stridor. No wheezing, rhonchi or rales.  Abdominal:     General: Abdomen is flat.  Musculoskeletal:        General: Swelling and tenderness present.     Cervical back: Normal range of motion.     Comments: Significant TTP noted to the right lateral wrist spanning just proximal to the joint to the MCP of the fifth digit. FROM of the fingers of the right hand. Cannot assess ROM of the right wrist due to pain. Mild erythema and edema appreciated on the ulnar aspect of the right wrist. Distal sensation intact. Palpable pulses.   Skin:    General: Skin is warm and dry.     Findings: Erythema present.   Neurological:     General: No focal deficit present.     Mental Status: She is alert and oriented to person, place, and time.  Psychiatric:        Mood and Affect: Mood normal.  Behavior: Behavior normal.    ED Results / Procedures / Treatments   Labs (all labs ordered are listed, but only abnormal results are displayed) Labs Reviewed - No data to display  EKG None  Radiology DG Wrist Complete Right  Result Date: 06/14/2019 CLINICAL DATA:  Fall wrist pain and swelling, worse for the past few days, no known injury EXAM: RIGHT WRIST - COMPLETE 3+ VIEW COMPARISON:  None FINDINGS: No acute bony abnormality. Specifically, no fracture, subluxation, or dislocation. Minimal soft tissue swelling is noted along the medial aspect of the ulna without subjacent osseous abnormality. IMPRESSION: No acute bony abnormality. Minimal soft tissue swelling along the medial aspect of the ulna. Electronically Signed   By: Lovena Le M.D.   On: 06/14/2019 21:42   Procedures Procedures   Medications Ordered in ED Medications  cephALEXin (KEFLEX) capsule 500 mg (500 mg Oral Given 06/14/19 2233)  ketorolac (TORADOL) 15 MG/ML injection 15 mg (15 mg Intramuscular Given 06/14/19 2233)    ED Course  I have reviewed the triage vital signs and the nursing notes.  Pertinent labs & imaging results that were available during my care of the patient were reviewed by me and considered in my medical decision making (see chart for details).    MDM Rules/Calculators/A&P                      10:39 PM Pt is a 44 year old female with physical exam findings consistent with cellulitis of the right lateral wrist. X-ray of the joint shows soft tissue swelling without bony involvement. Pt discussed with and evaluated by my attending Dr. Isla Pence. Pt given toradol for pain in the ED and discussed continued use of tylenol and ibuprofen for pain management at home. Will d/c with 7 days of keflex, first dose given in  the ED. She was also given referral to Dr. Caralyn Guile for follow up. She was given strict return precautions. She verbalized understanding of these. Her questions were answered. She was amicable at the time of discharge. VSS.   Final Clinical Impression(s) / ED Diagnoses Final diagnoses:  Cellulitis of right wrist    Rx / DC Orders ED Discharge Orders         Ordered    cephALEXin (KEFLEX) 500 MG capsule  4 times daily     06/14/19 2227           Rayna Sexton, PA-C 06/14/19 2244    Isla Pence, MD 06/14/19 2310

## 2019-06-14 NOTE — Discharge Instructions (Signed)
You have been diagnosed with cellulitis on the right wrist. I am prescribing you an antibiotic called Keflex. Please take this antibiotic four times per day for the next seven days. Please take this entire prescription and do not stop early even if you start to feel better sooner than later. Also, please follow up with the hand specialist regarding your wrist pain and infection. Lastly, if you have continued pain please continue to take tylenol and ibuprofen as needed. Please return to the emergency department with any new or worsening symptoms.

## 2019-06-14 NOTE — ED Notes (Signed)
Pt verbalizes understanding of DC instructions. Pt belongings returned and is ambulatory out of ED.  

## 2019-06-14 NOTE — ED Triage Notes (Signed)
Arrived POV from home. Patient reports worsening right wrist pain over past few days. Patient's right wrist is swollen. Red, and hot touch.

## 2019-10-07 ENCOUNTER — Other Ambulatory Visit: Payer: Self-pay

## 2019-10-07 ENCOUNTER — Ambulatory Visit
Admission: RE | Admit: 2019-10-07 | Discharge: 2019-10-07 | Disposition: A | Discharge status: Home / Self Care | Payer: No Typology Code available for payment source | Source: Ambulatory Visit | Attending: Physician Assistant | Admitting: Physician Assistant

## 2019-10-07 DIAGNOSIS — Z1231 Encounter for screening mammogram for malignant neoplasm of breast: Secondary | ICD-10-CM

## 2020-06-15 ENCOUNTER — Other Ambulatory Visit: Payer: Self-pay | Admitting: Physician Assistant

## 2020-06-15 DIAGNOSIS — Z1231 Encounter for screening mammogram for malignant neoplasm of breast: Secondary | ICD-10-CM

## 2020-07-24 ENCOUNTER — Other Ambulatory Visit: Payer: Self-pay

## 2020-07-24 ENCOUNTER — Encounter (HOSPITAL_COMMUNITY): Payer: Self-pay

## 2020-07-24 ENCOUNTER — Emergency Department (HOSPITAL_COMMUNITY)
Admission: EM | Admit: 2020-07-24 | Discharge: 2020-07-24 | Disposition: A | Discharge status: Home / Self Care | Payer: No Typology Code available for payment source | Attending: Emergency Medicine | Admitting: Emergency Medicine

## 2020-07-24 DIAGNOSIS — Z87891 Personal history of nicotine dependence: Secondary | ICD-10-CM | POA: Insufficient documentation

## 2020-07-24 DIAGNOSIS — J45909 Unspecified asthma, uncomplicated: Secondary | ICD-10-CM | POA: Insufficient documentation

## 2020-07-24 DIAGNOSIS — R21 Rash and other nonspecific skin eruption: Secondary | ICD-10-CM | POA: Insufficient documentation

## 2020-07-24 MED ORDER — CEPHALEXIN 500 MG PO CAPS: 500.0000 mg | ORAL_CAPSULE | Freq: Four times a day (QID) | ORAL | 0 refills | Status: AC

## 2020-07-24 MED ORDER — HYDROXYZINE HCL 25 MG PO TABS: 25.0000 mg | ORAL_TABLET | Freq: Four times a day (QID) | ORAL | 0 refills | Status: DC | PRN

## 2020-07-24 MED ORDER — CEPHALEXIN 500 MG PO CAPS
500.0000 mg | ORAL_CAPSULE | Freq: Once | ORAL | Status: AC
  Administered 2020-07-24: 500 mg via ORAL
  Filled 2020-07-24: qty 1

## 2020-07-24 NOTE — Discharge Instructions (Addendum)
-  Prescription for Keflex sent to the pharmacy.  This is an antibiotic.  Take as prescribed.  -Prescription also sent for Vistaril.  This is to help with itching.  If you do not like the way it makes you feel he should not take it.   -You should slowly taper off the prednisone.  Take 40 mg tomorrow, 30 mg the next day and 10 the last day.   -Follow-up with primary care doctor next week for recheck.

## 2020-07-24 NOTE — ED Notes (Signed)
Applied dry dressing to left lower arm

## 2020-07-24 NOTE — ED Triage Notes (Addendum)
Pt reports rash to left forearm that appeared on Sunday. Pt reports rash has become worse. Pt states she is taking prednisone without any relief.

## 2020-07-24 NOTE — ED Provider Notes (Signed)
[facility]-EMERGENCY DEPT Provider Note   CSN: [id number] Arrival date & time: 07/24/20  1639     History Chief Complaint  Patient presents with  . Rash    Candice Shaw is a 45 y.o. female with past medical history as listed below.  HPI Patient presents to emergency room today with chief complaint of rash x6 days.  Patient states she first noticed the rash after she was out walking in a park.  Rash is located on left forearm.  It started as a small red area and has progressively worsened.  She endorses associated pruritus.  She has history of frequent poison ivy and states this feels similar.  She went to urgent care and received a steroid shot day after symptom onset.  She states it did not improve and she followed up with primary care doctor and was started on prednisone taper.  She states despite taking this the rash has worsened.  It is now draining clear fluid.  Still having pruritus.  She denies any fever or chills.  Denies any difficulty breathing or shortness of breath.    Past Medical History:  Diagnosis Date  . 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 Asthma   . Headache(784.0)    migraines & sinus  . PONV (postoperative nausea and vomiting) 2012   had to get phenergan before d/c    There are no problems to display for this patient.   Past Surgical History:  Procedure Laterality Date  . ENDOMETRIAL ABLATION  2012  . LAPAROSCOPIC TUBAL LIGATION  11/10/2010   Procedure: LAPAROSCOPIC TUBAL LIGATION;  Surgeon: Jessee Avers;  Location: [facility];  Service: Gynecology;  Laterality: Bilateral;  with filshie clips   . TUBAL LIGATION       OB History    Gravida  1   Para      Term      Preterm      AB      Living  1     SAB      IAB      Ectopic      Multiple      Live Births  1           Family History  Problem Relation Age of Onset  . Breast cancer Cousin     Social History   Tobacco Use  . Smoking status: Former  Smoker    Packs/day: 0.50    Years: 15.00    Pack years: 7.50    Types: Cigarettes    Quit date: 11/04/2003    Years since quitting: 16.7  . Smokeless tobacco: Never Used  Vaping Use  . Vaping Use: Never used  Substance Use Topics  . Alcohol use: Yes    Comment: OCCASSIONALLY  . Drug use: No    Home Medications Prior to Admission medications   Medication Sig Start Date End Date Taking? Authorizing Provider  cephALEXin (KEFLEX) 500 MG capsule Take 1 capsule (500 mg total) by mouth 4 (four) times daily for 7 days. 07/25/20 08/01/20 Yes Walisiewicz, Shaneta Cervenka E, PA-C  hydrOXYzine (ATARAX/VISTARIL) 25 MG tablet Take 1 tablet (25 mg total) by mouth every 6 (six) hours as needed for itching. 07/24/20  Yes Walisiewicz, Remo Kirschenmann E, PA-C  cetirizine (ZYRTEC) 10 MG tablet Take 10 mg by mouth daily as needed. For allergies     [provider]  cholecalciferol (VITAMIN D) 1000 UNITS tablet Take 1,000 Units by mouth daily.  [provider]  Cyanocobalamin (VITAMIN B 12 PO) Take 1 tablet by mouth daily. Patient unsure of dose     [provider]  cyclobenzaprine (FLEXERIL) 10 MG tablet Take 10 mg by mouth at bedtime as needed. For headache     [provider]  doxycycline (VIBRAMYCIN) 100 MG capsule Take 1 capsule (100 mg total) by mouth 2 (two) times daily. Patient not taking: Reported on 09/26/2017 05/26/15   [name], PA-C  ECHINACEA PO Take 1 tablet by mouth daily. Pt unsure of dose     [provider]  fluconazole (DIFLUCAN) 150 MG tablet Take 1 tablet (150 mg total) by mouth once. Take after you complete the antibiotic course Patient not taking: Reported on 09/26/2017 05/26/15   [name], PA-C  HYDROcodone-acetaminophen (NORCO/VICODIN) 5-325 MG tablet Take 1-2 tablets by mouth every 4 (four) hours as needed. Patient not taking: Reported on 09/26/2017 05/26/15   [name], PA-C  ibuprofen (ADVIL,MOTRIN) 800 MG tablet Take 800 mg by mouth at  bedtime as needed. For headache      [provider]  Multiple Vitamin (MULTIVITAMIN) tablet Take 1 tablet by mouth daily. Nature Made Multi for Her     [provider]  Omega-3 Fatty Acids (FISH OIL) 1200 MG CAPS Take 1 capsule by mouth daily.      [provider]  valACYclovir (VALTREX) 500 MG tablet Take 500 mg by mouth 2 (two) times daily as needed.    [provider]    Allergies    Codeine  Review of Systems   Review of Systems All other systems are reviewed and are negative for acute change except as noted in the HPI.  Physical Exam Updated Vital Signs BP (!) 142/74 (BP Location: Right Arm)   Pulse 89   Temp 98.4 F (36.9 C) (Oral)   Resp 16   SpO2 100%   Physical Exam Vitals and nursing note reviewed.  Constitutional:      Appearance: She is well-developed. She is not ill-appearing or toxic-appearing.  HENT:     Head: Normocephalic and atraumatic.     Nose: Nose normal.  Eyes:     General: No scleral icterus.       Right eye: No discharge.        Left eye: No discharge.     Conjunctiva/sclera: Conjunctivae normal.  Neck:     Vascular: No JVD.  Cardiovascular:     Rate and Rhythm: Normal rate and regular rhythm.     Pulses: Normal pulses.     Heart sounds: Normal heart sounds.  Pulmonary:     Effort: Pulmonary effort is normal.     Breath sounds: Normal breath sounds.  Abdominal:     General: There is no distension.  Musculoskeletal:        General: Normal range of motion.     Cervical back: Normal range of motion.  Skin:    General: Skin is warm and dry.     Comments: Maculopapular rash on left forearm with having clear drainage.  Tender to palpation.  No skin sloughing.  Neurological:     Mental Status: She is oriented to person, place, and time.     GCS: GCS eye subscore is 4. GCS verbal subscore is 5. GCS motor subscore is 6.     Comments: Fluent speech, no facial droop.  Psychiatric:        Behavior: Behavior  normal.     ED Results / Procedures /  Treatments   Labs (all labs ordered are listed, but only abnormal results are displayed) Labs Reviewed - No data to display  EKG None  Radiology No results found.  Procedures Procedures   Medications Ordered in ED Medications  cephALEXin (KEFLEX) capsule 500 mg (has no administration in time range)    ED Course  I have reviewed the triage vital signs and the nursing notes.  Pertinent labs & imaging results that were available during my care of the patient were reviewed by me and considered in my medical decision making (see chart for details).    MDM Rules/Calculators/A&P                          History provided by patient with additional history obtained from chart review.    Rash consistent with poison ivy.. Patient denies any difficulty breathing or swallowing.  Pt has a patent airway without stridor and is handling secretions without difficulty; no angioedema. No blisters, no pustules, no warmth, no draining sinus tracts, no superficial abscesses, no bullous impetigo, no vesicles, no desquamation, no target lesions with dusky purpura or a central bulla.  She has been taking prednisone with no improvement.  Already had steroid injection at urgent care when symptoms were started.  Will cover for possible bacterial infection with Keflex.  Also prescribed Vistaril for pruritus.  No concern for SJS, TEN, TSS, tick borne illness, syphilis or other life-threatening condition.  Patient will need follow-up for recheck of rash in the next few days.  She plans to do so with PCP.  Strict return precautions discussed.   Portions of this note were generated with Scientist, clinical (histocompatibility and immunogenetics). Dictation errors may occur despite best attempts at proofreading.    Final Clinical Impression(s) / ED Diagnoses Final diagnoses:  Rash    Rx / DC Orders ED Discharge Orders         Ordered    cephALEXin (KEFLEX) 500 MG capsule  4 times daily         07/24/20 2043    hydrOXYzine (ATARAX/VISTARIL) 25 MG tablet  Every 6 hours PRN        07/24/20 2045           Shanon Ace, PA-C 07/24/20 2049    Charlynne Pander, MD 07/24/20 2329

## 2020-11-16 ENCOUNTER — Ambulatory Visit
Admission: RE | Admit: 2020-11-16 | Discharge: 2020-11-16 | Disposition: A | Discharge status: Home / Self Care | Payer: No Typology Code available for payment source | Source: Ambulatory Visit | Attending: Physician Assistant | Admitting: Physician Assistant

## 2020-11-16 ENCOUNTER — Other Ambulatory Visit: Payer: Self-pay

## 2020-11-16 DIAGNOSIS — Z1231 Encounter for screening mammogram for malignant neoplasm of breast: Secondary | ICD-10-CM

## 2021-02-06 IMAGING — CR DG WRIST COMPLETE 3+V*R*
4 series · 4 of 4 positions shown · non-contrast
Comparison: None

CLINICAL DATA: Fall wrist pain and swelling, worse for the past few
days, no known injury

EXAM:
RIGHT WRIST - COMPLETE 3+ VIEW

[x wrist pa right]
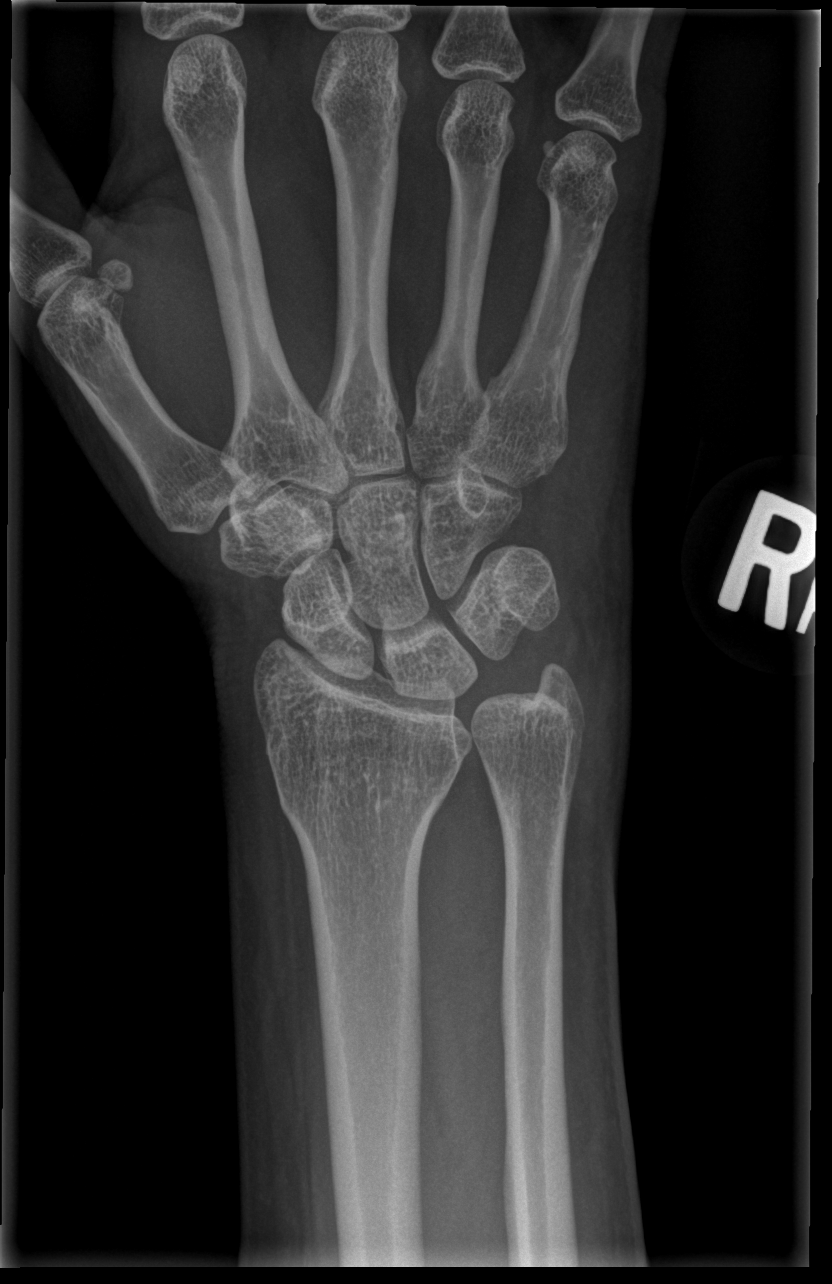

[x wrist obl right]
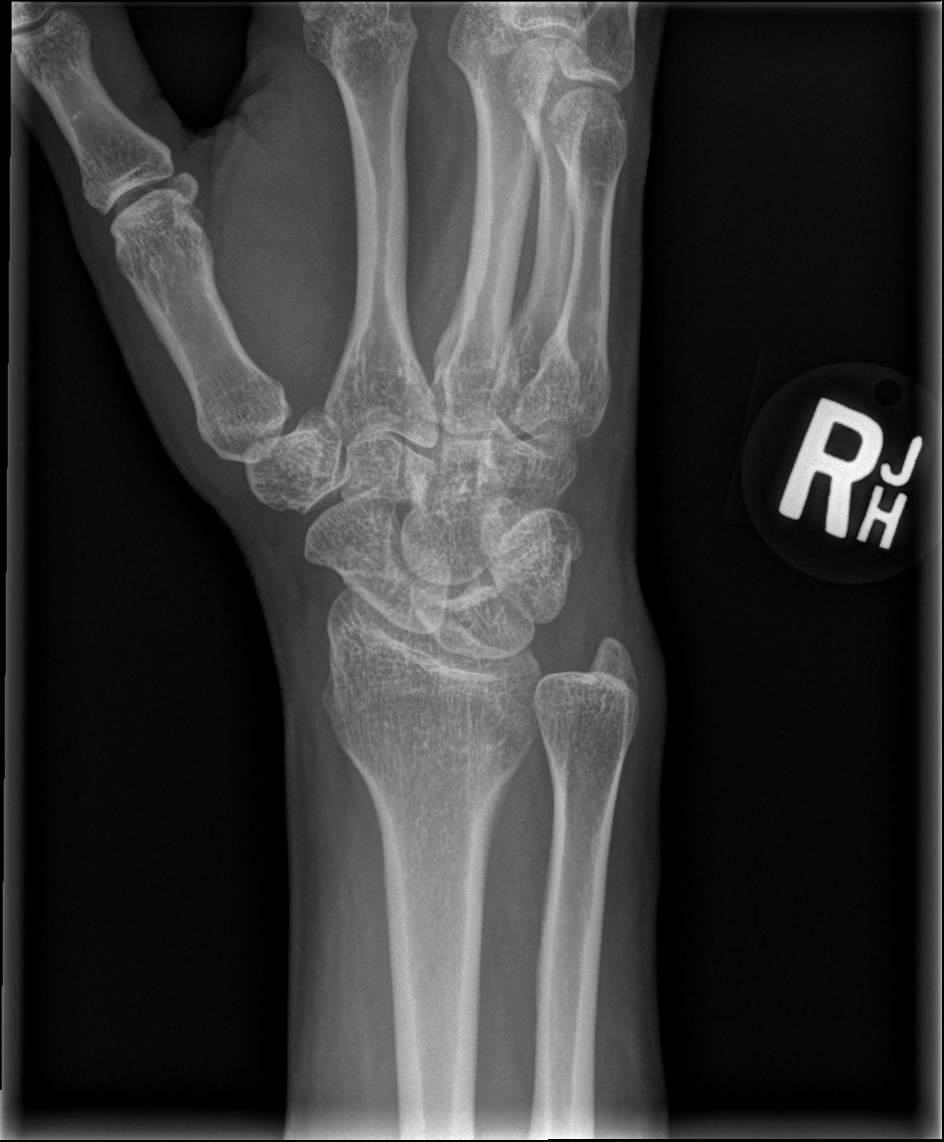

[x wrist navicular view right]
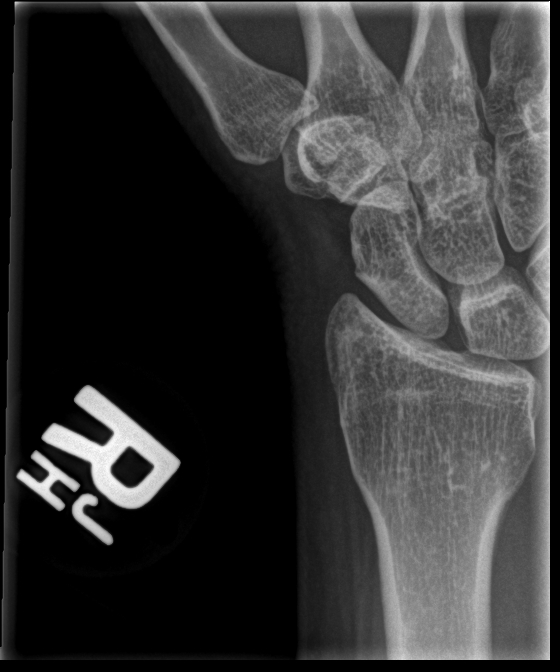

[x wrist lat right]
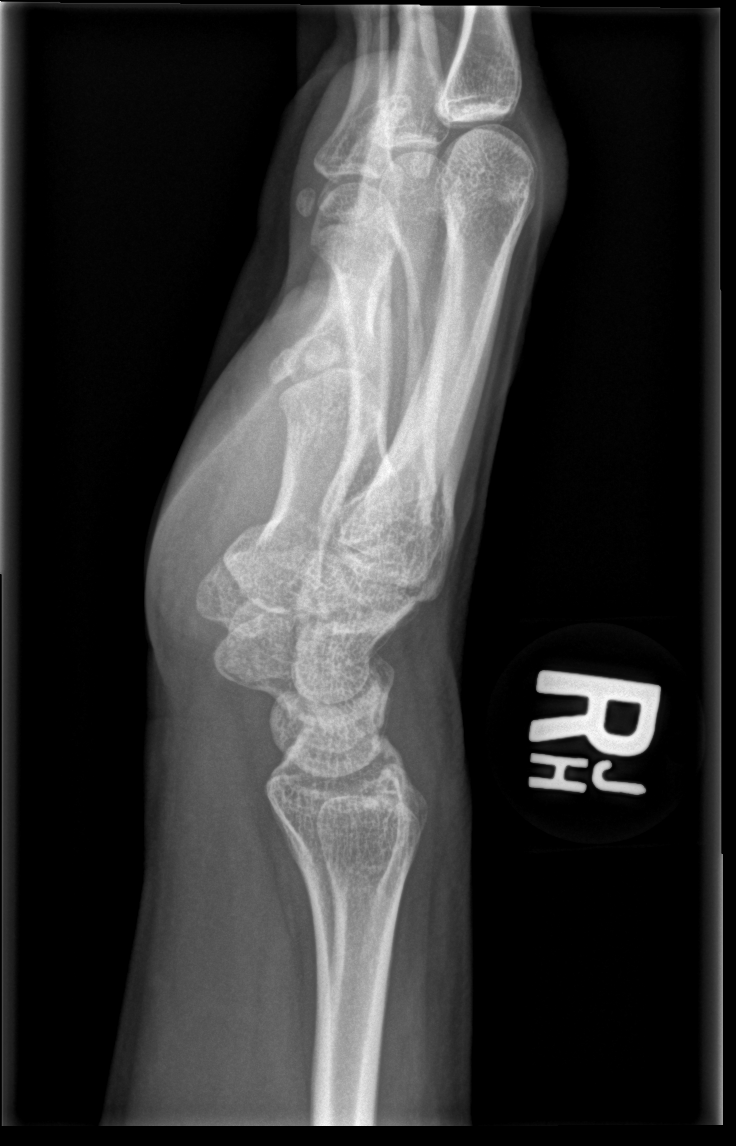

[4 of 4 positions shown; findings below may reference images not displayed]

FINDINGS: No acute bony abnormality. Specifically, no fracture, subluxation,
or dislocation. Minimal soft tissue swelling is noted along the
medial aspect of the ulna without subjacent osseous abnormality.
IMPRESSION: No acute bony abnormality. Minimal soft tissue swelling along the
medial aspect of the ulna.

## 2021-09-14 ENCOUNTER — Other Ambulatory Visit: Payer: Self-pay | Admitting: Physician Assistant

## 2021-09-14 DIAGNOSIS — Z1231 Encounter for screening mammogram for malignant neoplasm of breast: Secondary | ICD-10-CM

## 2021-11-22 ENCOUNTER — Ambulatory Visit
Admission: RE | Admit: 2021-11-22 | Discharge: 2021-11-22 | Disposition: A | Discharge status: Home / Self Care | Payer: No Typology Code available for payment source | Source: Ambulatory Visit | Attending: Physician Assistant | Admitting: Physician Assistant

## 2021-11-22 DIAGNOSIS — Z1231 Encounter for screening mammogram for malignant neoplasm of breast: Secondary | ICD-10-CM

## 2022-09-05 ENCOUNTER — Other Ambulatory Visit: Payer: Self-pay | Admitting: Physician Assistant

## 2022-09-05 DIAGNOSIS — Z1231 Encounter for screening mammogram for malignant neoplasm of breast: Secondary | ICD-10-CM

## 2022-11-25 ENCOUNTER — Ambulatory Visit
Admission: RE | Admit: 2022-11-25 | Discharge: 2022-11-25 | Disposition: A | Discharge status: Home / Self Care | Payer: No Typology Code available for payment source | Source: Ambulatory Visit | Attending: Physician Assistant | Admitting: Physician Assistant

## 2022-11-25 ENCOUNTER — Ambulatory Visit: Payer: No Typology Code available for payment source

## 2022-11-25 DIAGNOSIS — Z1231 Encounter for screening mammogram for malignant neoplasm of breast: Secondary | ICD-10-CM

## 2023-10-13 ENCOUNTER — Other Ambulatory Visit: Payer: Self-pay | Admitting: Physician Assistant

## 2023-10-13 DIAGNOSIS — Z1231 Encounter for screening mammogram for malignant neoplasm of breast: Secondary | ICD-10-CM

## 2023-12-04 ENCOUNTER — Ambulatory Visit
Admission: RE | Admit: 2023-12-04 | Discharge: 2023-12-04 | Disposition: A | Discharge status: Home / Self Care | Source: Ambulatory Visit | Attending: Physician Assistant | Admitting: Physician Assistant

## 2023-12-04 DIAGNOSIS — Z1231 Encounter for screening mammogram for malignant neoplasm of breast: Secondary | ICD-10-CM

## 2023-12-08 ENCOUNTER — Other Ambulatory Visit: Payer: Self-pay | Admitting: Physician Assistant

## 2023-12-08 DIAGNOSIS — R928 Other abnormal and inconclusive findings on diagnostic imaging of breast: Secondary | ICD-10-CM

## 2023-12-22 ENCOUNTER — Other Ambulatory Visit: Payer: Self-pay

## 2023-12-22 DIAGNOSIS — R921 Mammographic calcification found on diagnostic imaging of breast: Secondary | ICD-10-CM

## 2024-02-12 NOTE — Progress Notes (Unsigned)
 [name] is a 48 y.o. female who presents to [facility] clinic today with no complaints. Patient had a screening mammogram completed 12/07/2023 that additional imaging of the right breast is recommended for follow up.   Pap Smear: Pap smear not completed today. Last Pap smear was 12/04/2023 at [facility] and normal with negative HPV. Per patient has no history of an abnormal Pap smear. Last Pap smear result is in Epic.    Physical exam: Breasts Breasts symmetrical. No skin abnormalities bilateral breasts. No nipple retraction bilateral breasts. No nipple discharge bilateral breasts. No lymphadenopathy. No lumps palpated bilateral breasts. No complaints of pain or tenderness on exam.     MM 3D SCREENING MAMMOGRAM BILATERAL BREAST Result Date: 12/07/2023 CLINICAL DATA:  Screening. EXAM: DIGITAL SCREENING BILATERAL MAMMOGRAM WITH TOMOSYNTHESIS AND CAD TECHNIQUE: Bilateral screening digital craniocaudal and mediolateral oblique mammograms were obtained. Bilateral screening digital breast tomosynthesis was performed. The images were evaluated with computer-aided detection. COMPARISON:  Previous exam(s). ACR Breast Density Category c: The breasts are heterogeneously dense, which may obscure small masses. FINDINGS: In the right breast, calcifications warrant further evaluation with magnified views. In the left breast, no findings suspicious for malignancy. IMPRESSION: Further evaluation is suggested for calcifications in the right breast. RECOMMENDATION: Diagnostic mammogram of the right breast. (Code:FI-R-7M) The patient will be contacted regarding the findings, and additional imaging will be scheduled. BI-RADS CATEGORY  0: Incomplete: Need additional imaging evaluation. Electronically Signed   By: Inocente Ast M.D.   On: 12/07/2023 11:19   MM 3D SCREENING MAMMOGRAM BILATERAL BREAST Result Date: 11/29/2022 CLINICAL DATA:  Screening. EXAM: DIGITAL SCREENING BILATERAL MAMMOGRAM WITH  TOMOSYNTHESIS AND CAD TECHNIQUE: Bilateral screening digital craniocaudal and mediolateral oblique mammograms were obtained. Bilateral screening digital breast tomosynthesis was performed. The images were evaluated with computer-aided detection. COMPARISON:  Previous exam(s). ACR Breast Density Category c: The breasts are heterogeneously dense, which may obscure small masses. FINDINGS: There are no findings suspicious for malignancy. IMPRESSION: No mammographic evidence of malignancy. A result letter of this screening mammogram will be mailed directly to the patient. RECOMMENDATION: Screening mammogram in one year. (Code:SM-B-01Y) BI-RADS CATEGORY  1: Negative. Electronically Signed   By: Almarie Daring M.D.   On: 11/29/2022 16:04   MM 3D SCREEN BREAST BILATERAL Result Date: 11/24/2021 CLINICAL DATA:  Screening. EXAM: DIGITAL SCREENING BILATERAL MAMMOGRAM WITH TOMOSYNTHESIS AND CAD TECHNIQUE: Bilateral screening digital craniocaudal and mediolateral oblique mammograms were obtained. Bilateral screening digital breast tomosynthesis was performed. The images were evaluated with computer-aided detection. COMPARISON:  Previous exam(s). ACR Breast Density Category c: The breast tissue is heterogeneously dense, which may obscure small masses. FINDINGS: There are no findings suspicious for malignancy. IMPRESSION: No mammographic evidence of malignancy. A result letter of this screening mammogram will be mailed directly to the patient. RECOMMENDATION: Screening mammogram in one year. (Code:SM-B-01Y) BI-RADS CATEGORY  1: Negative. Electronically Signed   By: Debby Satterfield M.D.   On: 11/24/2021 08:14   MM 3D SCREEN BREAST BILATERAL Result Date: 11/17/2020 CLINICAL DATA:  Screening. EXAM: DIGITAL SCREENING BILATERAL MAMMOGRAM WITH TOMOSYNTHESIS AND CAD TECHNIQUE: Bilateral screening digital craniocaudal and mediolateral oblique mammograms were obtained. Bilateral screening digital breast tomosynthesis was performed.  The images were evaluated with computer-aided detection. COMPARISON:  Previous exam(s). ACR Breast Density Category c: The breast tissue is heterogeneously dense, which may obscure small masses. FINDINGS: There are no findings suspicious for malignancy. IMPRESSION: No mammographic evidence of malignancy. A result letter of this screening mammogram will be mailed  directly to the patient. RECOMMENDATION: Screening mammogram in one year. (Code:[id number]-01Y) BI-RADS CATEGORY  1: Negative. Electronically Signed   By: Dina  Arceo M.D.   On: 11/17/2020 15:12   MM 3D SCREEN BREAST BILATERAL Result Date: 10/09/2019 CLINICAL DATA:  Screening. EXAM: DIGITAL SCREENING BILATERAL MAMMOGRAM WITH TOMO AND CAD COMPARISON:  Previous exam(s). ACR Breast Density Category d: The breast tissue is extremely dense, which lowers the sensitivity of mammography FINDINGS: There are no findings suspicious for malignancy. Images were processed with CAD. IMPRESSION: No mammographic evidence of malignancy. A result letter of this screening mammogram will be mailed directly to the patient. RECOMMENDATION: Screening mammogram in one year. (Code:[id number]) BI-RADS CATEGORY  1: Negative. Electronically Signed   By: Devere Bihari M.D.   On: 10/09/2019 11:04    Pelvic/Bimanual Pap is not indicated today per BCCCP guidelines.   Smoking History: Patient is a former smoker that quit 21 years ago.    Patient Navigation: Patient education provided. Access to services provided for patient through BCCCP program.   Colorectal Cancer Screening: Per patient has never had colonoscopy completed. Patient stated she completed the cologuard test given by her PCP today and has mailed. No complaints today.    Breast and Cervical Cancer Risk Assessment: Patient has family history of a 2nd paternal cousin having breast cancer. Patient has no known genetic mutations or history of radiation treatment to the chest before age 75. Patient does not have history of  cervical dysplasia, immunocompromised, or DES exposure in-utero.  Risk Scores as of Encounter on 02/13/2024     Candice Shaw           5-year 1.33%   Lifetime 12.15%            Last calculated by Silas, Ansyi K, CMA on 02/13/2024 at  2:14 PM       A: BCCCP exam with pap smear No complaints.  P: Referred patient to the [facility] for a right breast diagnostic mammogram per recommendation. Appointment scheduled Tuesday, February 13, 2024 at 1520.  Driscilla Wanda SQUIBB, RN 02/12/2024 10:49 AM

## 2024-02-13 ENCOUNTER — Ambulatory Visit
Admission: RE | Admit: 2024-02-13 | Discharge: 2024-02-13 | Disposition: A | Discharge status: Home / Self Care | Source: Ambulatory Visit | Attending: Obstetrics and Gynecology | Admitting: Obstetrics and Gynecology

## 2024-02-13 ENCOUNTER — Other Ambulatory Visit

## 2024-02-13 ENCOUNTER — Ambulatory Visit: Payer: Self-pay

## 2024-02-13 ENCOUNTER — Other Ambulatory Visit: Payer: Self-pay | Admitting: Obstetrics and Gynecology

## 2024-02-13 VITALS — BP 113/71 | Ht 66.0 in | Wt 121.0 lb

## 2024-02-13 DIAGNOSIS — Z1239 Encounter for other screening for malignant neoplasm of breast: Secondary | ICD-10-CM

## 2024-02-13 DIAGNOSIS — R921 Mammographic calcification found on diagnostic imaging of breast: Secondary | ICD-10-CM

## 2024-02-13 DIAGNOSIS — Z01419 Encounter for gynecological examination (general) (routine) without abnormal findings: Secondary | ICD-10-CM

## 2024-02-13 NOTE — Patient Instructions (Signed)
 Explained breast self awareness with Candice Shaw. Patient did not need a Pap smear today due to last Pap smear and HPV typing was 12/04/2023. Let her know BCCCP will cover Pap smears and HPV typing every 5 years unless has a history of abnormal Pap smears. Referred patient to the [facility] for a right breast diagnostic mammogram per recommendation. Appointment scheduled Tuesday, February 13, 2024 at 1520. Patient aware of appointment and will be there. Candice Shaw verbalized understanding.  Gabryel Files, Wanda Ship, RN 2:07 PM

## 2024-02-26 ENCOUNTER — Telehealth: Payer: Self-pay

## 2024-02-26 NOTE — Telephone Encounter (Signed)
 Left message for patient stating that I was calling to follow-up with her about questions that she had regarding a colonoscopy. Left name and number for patient to call back.

## 2024-02-29 ENCOUNTER — Telehealth: Payer: Self-pay

## 2024-02-29 NOTE — Telephone Encounter (Signed)
 Spoke with patient about positive cologuard test. Patient needs to have a colonoscopy done but cannot afford to pay out of pocket for the procedure. Explained to patient that I can send her the financial assistance application for [facility] and if approved she could have the colonoscopy done with [facility]. Will mail patient the application today. Patient will get her PCP to send a referral to [facility].

## 2024-04-08 ENCOUNTER — Telehealth: Payer: Self-pay

## 2024-04-08 NOTE — Telephone Encounter (Signed)
 Spoke with patient about balance that patient said was showing up on her account with [facility]. Let the patient know that the billing department at [facility] is not showing a balance on her account right now. Patient stated that she has already paid for her screening mammo out of pocket. Let patient know that if she ends up getting a bill for the diagnostic mammo on 11/18 to let us  know. Patient voiced understanding.

## 2024-07-29 ENCOUNTER — Encounter

## 2024-08-14 ENCOUNTER — Encounter
